# Patient Record
Sex: Female | Born: 1970 | Race: White | Hispanic: No | Marital: Married | Smoking: Never smoker
Health system: Southern US, Community
[De-identification: ages and names within clinical notes are randomized; demographics above are authoritative.]

## PROBLEM LIST (undated history)

## (undated) ENCOUNTER — Emergency Department (HOSPITAL_BASED_OUTPATIENT_CLINIC_OR_DEPARTMENT_OTHER): Admission: EM | Source: Home / Self Care

## (undated) DIAGNOSIS — E785 Hyperlipidemia, unspecified: Secondary | ICD-10-CM

## (undated) DIAGNOSIS — Z860100 Personal history of colon polyps, unspecified: Secondary | ICD-10-CM

## (undated) DIAGNOSIS — J45990 Exercise induced bronchospasm: Secondary | ICD-10-CM

## (undated) DIAGNOSIS — E559 Vitamin D deficiency, unspecified: Secondary | ICD-10-CM

## (undated) DIAGNOSIS — J309 Allergic rhinitis, unspecified: Secondary | ICD-10-CM

## (undated) DIAGNOSIS — D509 Iron deficiency anemia, unspecified: Secondary | ICD-10-CM

## (undated) DIAGNOSIS — F329 Major depressive disorder, single episode, unspecified: Secondary | ICD-10-CM

## (undated) DIAGNOSIS — K649 Unspecified hemorrhoids: Secondary | ICD-10-CM

## (undated) DIAGNOSIS — K219 Gastro-esophageal reflux disease without esophagitis: Secondary | ICD-10-CM

## (undated) DIAGNOSIS — K529 Noninfective gastroenteritis and colitis, unspecified: Secondary | ICD-10-CM

## (undated) DIAGNOSIS — E611 Iron deficiency: Secondary | ICD-10-CM

## (undated) DIAGNOSIS — Z973 Presence of spectacles and contact lenses: Secondary | ICD-10-CM

## (undated) DIAGNOSIS — G4733 Obstructive sleep apnea (adult) (pediatric): Secondary | ICD-10-CM

## (undated) DIAGNOSIS — M5134 Other intervertebral disc degeneration, thoracic region: Secondary | ICD-10-CM

## (undated) DIAGNOSIS — N183 Chronic kidney disease, stage 3 unspecified: Secondary | ICD-10-CM

## (undated) DIAGNOSIS — R000 Tachycardia, unspecified: Secondary | ICD-10-CM

## (undated) DIAGNOSIS — G8929 Other chronic pain: Secondary | ICD-10-CM

## (undated) DIAGNOSIS — E7800 Pure hypercholesterolemia, unspecified: Secondary | ICD-10-CM

## (undated) DIAGNOSIS — F952 Tourette's disorder: Secondary | ICD-10-CM

## (undated) DIAGNOSIS — K5901 Slow transit constipation: Secondary | ICD-10-CM

## (undated) DIAGNOSIS — M810 Age-related osteoporosis without current pathological fracture: Secondary | ICD-10-CM

## (undated) DIAGNOSIS — K2270 Barrett's esophagus without dysplasia: Secondary | ICD-10-CM

## (undated) DIAGNOSIS — F411 Generalized anxiety disorder: Secondary | ICD-10-CM

## (undated) DIAGNOSIS — H6990 Unspecified Eustachian tube disorder, unspecified ear: Secondary | ICD-10-CM

## (undated) DIAGNOSIS — M1990 Unspecified osteoarthritis, unspecified site: Secondary | ICD-10-CM

## (undated) DIAGNOSIS — M47812 Spondylosis without myelopathy or radiculopathy, cervical region: Secondary | ICD-10-CM

## (undated) HISTORY — DX: Noninfective gastroenteritis and colitis, unspecified: K52.9

## (undated) HISTORY — DX: Personal history of colon polyps, unspecified: Z86.0100

## (undated) HISTORY — DX: Vitamin D deficiency, unspecified: E55.9

## (undated) HISTORY — DX: Unspecified eustachian tube disorder, unspecified ear: H69.90

## (undated) HISTORY — DX: Unspecified osteoarthritis, unspecified site: M19.90

## (undated) HISTORY — DX: Gastro-esophageal reflux disease without esophagitis: K21.9

## (undated) HISTORY — DX: Barrett's esophagus without dysplasia: K22.70

## (undated) HISTORY — DX: Spondylosis without myelopathy or radiculopathy, cervical region: M47.812

## (undated) HISTORY — DX: Tourette's disorder: F95.2

## (undated) HISTORY — DX: Iron deficiency: E61.1

## (undated) HISTORY — DX: Age-related osteoporosis without current pathological fracture: M81.0

## (undated) HISTORY — DX: Pure hypercholesterolemia, unspecified: E78.00

## (undated) HISTORY — DX: Chronic kidney disease, stage 3 unspecified: N18.30

---

## 2004-04-23 ENCOUNTER — Other Ambulatory Visit: Admission: RE | Admit: 2004-04-23 | Discharge: 2004-04-23 | Payer: Self-pay | Admitting: Family Medicine

## 2004-07-21 ENCOUNTER — Ambulatory Visit (HOSPITAL_COMMUNITY): Admission: RE | Admit: 2004-07-21 | Discharge: 2004-07-21 | Payer: Self-pay | Admitting: Gastroenterology

## 2005-05-14 ENCOUNTER — Other Ambulatory Visit: Admission: RE | Admit: 2005-05-14 | Discharge: 2005-05-14 | Payer: Self-pay | Admitting: Family Medicine

## 2012-12-02 DIAGNOSIS — E785 Hyperlipidemia, unspecified: Secondary | ICD-10-CM | POA: Insufficient documentation

## 2013-02-27 DIAGNOSIS — F411 Generalized anxiety disorder: Secondary | ICD-10-CM | POA: Insufficient documentation

## 2013-10-24 DIAGNOSIS — F339 Major depressive disorder, recurrent, unspecified: Secondary | ICD-10-CM | POA: Insufficient documentation

## 2014-08-13 DIAGNOSIS — Z79899 Other long term (current) drug therapy: Secondary | ICD-10-CM | POA: Insufficient documentation

## 2015-11-27 HISTORY — PX: ROBOTIC ASSISTED LAP VAGINAL HYSTERECTOMY: SHX2362

## 2015-12-25 ENCOUNTER — Other Ambulatory Visit: Payer: Self-pay | Admitting: Obstetrics and Gynecology

## 2015-12-25 HISTORY — PX: ROBOTIC ASSISTED TOTAL HYSTERECTOMY WITH SALPINGECTOMY: SHX6679

## 2016-03-04 ENCOUNTER — Other Ambulatory Visit: Payer: Self-pay | Admitting: Family Medicine

## 2016-03-04 DIAGNOSIS — M47812 Spondylosis without myelopathy or radiculopathy, cervical region: Secondary | ICD-10-CM

## 2016-03-04 DIAGNOSIS — M5412 Radiculopathy, cervical region: Secondary | ICD-10-CM

## 2016-03-12 ENCOUNTER — Other Ambulatory Visit: Payer: Self-pay

## 2016-03-17 ENCOUNTER — Ambulatory Visit
Admission: RE | Admit: 2016-03-17 | Discharge: 2016-03-17 | Disposition: A | Discharge status: Home / Self Care | Payer: BLUE CROSS/BLUE SHIELD | Source: Ambulatory Visit | Attending: Family Medicine | Admitting: Family Medicine

## 2016-03-17 DIAGNOSIS — M5412 Radiculopathy, cervical region: Secondary | ICD-10-CM

## 2016-03-17 DIAGNOSIS — M47812 Spondylosis without myelopathy or radiculopathy, cervical region: Secondary | ICD-10-CM

## 2016-03-17 MED ORDER — IOHEXOL 300 MG/ML  SOLN
1.0000 mL | Freq: Once | INTRAMUSCULAR | Status: AC | PRN
  Administered 2016-03-17: 1 mL via EPIDURAL

## 2016-03-17 MED ORDER — TRIAMCINOLONE ACETONIDE 40 MG/ML IJ SUSP (RADIOLOGY)
60.0000 mg | Freq: Once | INTRAMUSCULAR | Status: AC
  Administered 2016-03-17: 60 mg via EPIDURAL

## 2016-03-17 NOTE — Discharge Instructions (Signed)

## 2016-04-15 ENCOUNTER — Ambulatory Visit (INDEPENDENT_AMBULATORY_CARE_PROVIDER_SITE_OTHER): Payer: BLUE CROSS/BLUE SHIELD | Admitting: Family Medicine

## 2016-04-15 ENCOUNTER — Encounter: Payer: Self-pay | Admitting: Family Medicine

## 2016-04-15 VITALS — BP 137/83 | HR 93 | Ht 65.0 in | Wt 155.0 lb

## 2016-04-15 DIAGNOSIS — G609 Hereditary and idiopathic neuropathy, unspecified: Secondary | ICD-10-CM | POA: Diagnosis not present

## 2016-04-15 DIAGNOSIS — M542 Cervicalgia: Secondary | ICD-10-CM | POA: Diagnosis not present

## 2016-04-15 NOTE — Patient Instructions (Signed)
Given the distribution of your numbness, the relatively normal CT scans you may be dealing with a neurologic condition and not a neck issue. We will evaluate for both - we will refer you to a neurologist but also do an MRI of your cervical spine.

## 2016-04-19 DIAGNOSIS — G609 Hereditary and idiopathic neuropathy, unspecified: Secondary | ICD-10-CM | POA: Insufficient documentation

## 2016-04-19 NOTE — Progress Notes (Addendum)
PCP: Dr. Caffie Damme  Subjective:   HPI: Patient is a 45 y.o. female here for neck pain.  Patient reports she's had over 2 months of unusual numbness, tingling. Reports symptoms have occurred in neck, into both arms, around mouth, and in her feet. Has tried muscle relaxants, tiger balm, prednisone without benefit. PCP ordered CT of cervical spine along with ESI - had 1-2 weeks benefit from the [facility]. No bowel/bladder dysfunction. No speech, vision changes.  No past medical history on file.  Current Outpatient Prescriptions on File Prior to Visit  Medication Sig Dispense Refill  . fexofenadine (ALLEGRA) 180 MG tablet 1 mg.    . ibuprofen (ADVIL,MOTRIN) 600 MG tablet Take 600 mg by mouth.    . methocarbamol (ROBAXIN) 500 MG tablet Take 1,000 mg by mouth.    . mirabegron ER (MYRBETRIQ) 25 MG TB24 tablet Take 25 mg by mouth.    . montelukast (SINGULAIR) 10 MG tablet Take 10 mg by mouth.    Marland Kitchen omeprazole (PRILOSEC) 40 MG capsule Take 40 mg by mouth.    . oxyCODONE-acetaminophen (PERCOCET/ROXICET) 5-325 MG tablet Take by mouth.    . simvastatin (ZOCOR) 40 MG tablet Take 40 mg by mouth.     No current facility-administered medications on file prior to visit.     No past surgical history on file.  No Known Allergies  Social History   Social History  . Marital status: Married    Spouse name: N/A  . Number of children: N/A  . Years of education: N/A   Occupational History  . Not on file.   Social History Main Topics  . Smoking status: Never Smoker  . Smokeless tobacco: Never Used  . Alcohol use Not on file  . Drug use: Unknown  . Sexual activity: Not on file   Other Topics Concern  . Not on file   Social History Narrative  . No narrative on file    No family history on file.  BP 137/83   Pulse 93   Ht 5\' 5"  (1.651 m)   Wt 155 lb (70.3 kg)   BMI 25.79 kg/m   Review of Systems: See HPI above.    Objective:  Physical Exam:  Gen: NAD, comfortable in exam  room  Neck: No gross deformity, swelling, bruising. TTP mildly midline low cervical region. FROM neck without pain. BUE strength 5/5.   Sensation intact to light touch currently.   2+ equal reflexes in triceps, biceps, brachioradialis tendons. Negative spurlings. NV intact distal BUEs.  Back: No gross deformity, scoliosis. No TTP .  No midline or bony TTP. FROM. Strength LEs 5/5 all muscle groups.   2+ MSRs in patellar and achilles tendons, equal bilaterally. Negative SLRs. Sensation intact to light touch bilaterally.    Assessment & Plan:  1. Neuropathy - unusual symptoms and distribution in otherwise healthy person.  No history of diabetes.  Distribution would suggest more of a neurologic issue than from a simple disc bulge or herniation - one exception may be a central disc bulge/herniation but this unlikely to cause the intermittent perioral numbness.  Advised we go ahead with neurology referral.  MRI of cervical spine as well.  Tylenol or nsaids if needed for pain (which is mild, does not have currently).  Addendum:  MRI reviewed and discussed with patient.  She only has left sided disc/osteophyte complex at C6-7.  No central disc bulge or spinal stenosis that could possibly account for her extremity symptoms.  Encouraged to follow  through with neurology referral for evaluation.

## 2016-04-19 NOTE — Assessment & Plan Note (Signed)
unusual symptoms and distribution in otherwise healthy person.  No history of diabetes.  Distribution would suggest more of a neurologic issue than from a simple disc bulge or herniation - one exception may be a central disc bulge/herniation but this unlikely to cause the intermittent perioral numbness.  Advised we go ahead with neurology referral.  MRI of cervical spine as well.  Tylenol or nsaids if needed for pain (which is mild, does not have currently).

## 2016-04-25 ENCOUNTER — Ambulatory Visit
Admission: RE | Admit: 2016-04-25 | Discharge: 2016-04-25 | Disposition: A | Discharge status: Home / Self Care | Payer: BLUE CROSS/BLUE SHIELD | Source: Ambulatory Visit | Attending: Family Medicine | Admitting: Family Medicine

## 2016-04-25 DIAGNOSIS — M542 Cervicalgia: Secondary | ICD-10-CM

## 2016-04-26 NOTE — Addendum Note (Signed)
Addended by: Kathi SimpersWISE, Dinnis Rog F on: 04/26/2016 08:27 AM   Modules accepted: Orders

## 2016-04-28 ENCOUNTER — Telehealth: Payer: Self-pay | Admitting: Family Medicine

## 2016-04-28 NOTE — Telephone Encounter (Signed)
Spoke with patient re: MRI results.  Will check on her neurology referral.

## 2016-05-11 ENCOUNTER — Encounter: Payer: Self-pay | Admitting: Diagnostic Neuroimaging

## 2016-05-11 ENCOUNTER — Ambulatory Visit (INDEPENDENT_AMBULATORY_CARE_PROVIDER_SITE_OTHER): Payer: BLUE CROSS/BLUE SHIELD | Admitting: Diagnostic Neuroimaging

## 2016-05-11 VITALS — BP 128/78 | HR 88 | Ht 65.0 in | Wt 156.8 lb

## 2016-05-11 DIAGNOSIS — R200 Anesthesia of skin: Secondary | ICD-10-CM

## 2016-05-11 DIAGNOSIS — R202 Paresthesia of skin: Secondary | ICD-10-CM | POA: Diagnosis not present

## 2016-05-11 NOTE — Patient Instructions (Signed)
Thank you for coming to see [place] at [facility]. I hope we have been able to provide you high quality care today.  You may receive a patient satisfaction survey over the next few weeks. We would appreciate your feedback and comments so that we may continue to improve ourselves and the health of our patients.  - I will check MRI brain   ~~~~~~~~~~~~~~~~~~~~~~~~~~~~~~~~~~~~~~~~~~~~~~~~~~~~~~~~~~~~~~~~~  DR. PENUMALLI'S GUIDE TO HAPPY AND HEALTHY LIVING These are some of my general health and wellness recommendations. Some of them may apply to you better than others. Please use common sense as you try these suggestions and feel free to ask me any questions.   ACTIVITY/FITNESS Mental, social, emotional and physical stimulation are very important for brain and body health. Try learning a new activity (arts, music, language, sports, games).  Keep moving your body to the best of your abilities. You can do this at home, inside or outside, the park, community center, gym or anywhere you like. Consider a physical therapist or personal trainer to get started. Consider the app Sworkit. Fitness trackers such as smart-watches, smart-phones or Fitbits can help as well.   NUTRITION Eat more plants: colorful vegetables, nuts, seeds and berries.  Eat less sugar, salt, preservatives and processed foods.  Avoid toxins such as cigarettes and alcohol.  Drink water when you are thirsty. Warm water with a slice of lemon is an excellent morning drink to start the day.  Consider these websites for more information The Nutrition Source (https://www.henry-hernandez.biz/) Precision Nutrition (WindowBlog.ch)   RELAXATION Consider practicing mindfulness meditation or other relaxation techniques such as deep breathing, prayer, yoga, tai chi, massage. See website mindful.org or the apps Headspace or Calm to help get started.   SLEEP Try to get at least  7-8+ hours sleep per day. Regular exercise and reduced caffeine will help you sleep better. Practice good sleep hygeine techniques. See website sleep.org for more information.   PLANNING Prepare estate planning, living will, healthcare POA documents. Sometimes this is best planned with the help of an attorney. Theconversationproject.org and agingwithdignity.org are excellent resources.

## 2016-05-11 NOTE — Progress Notes (Signed)
[facility]  PATIENT: Shelley Armstrong DOB: [birth year]  REFERRING CLINICIAN: Norton BlizzardShane Armstrong HISTORY FROM: patient  REASON FOR VISIT: new consult    HISTORICAL  CHIEF COMPLAINT:  Chief Complaint  Patient presents with  . Peripheral Neuropathy    rm 6, New Pt, "tingling/numbness (like asleep feeling) in both hands, both feet, neck, around the mouth"  . Pain    HISTORY OF PRESENT ILLNESS:   45 year old female here for evaluation of numbness and tingling. 2-3 months ago patient onset of numbness and tingling in the fingers, feet, neck, lips and mouth. Symptoms fluctuate but last hours at a time. Symptoms are worse in the evening. Patient denies any weakness, balance or gait difficulties. Patient [facility]. She had MRI of the cervical spine which showed disc bulge at C6-7 level and was treated with epidural steroid injection. Symptoms have slightly improved but continue. Also having increasing headaches.  Patient has long history of anxiety since age 45 years old. She is not been on medication for the past 5 years. Anxiety is mild under control.  Patient has snoring and has been diagnosed with sleep apnea in the past but is not on CPAP therapy.  Patient having increasing chills and sweats.  Patient reports normal B12, thyroid function and diabetes testing per PCP.   REVIEW OF SYSTEMS: Full 14 system review of systems performed and negative with exception of: Only as per history of present illness.  ALLERGIES: No Known Allergies  HOME MEDICATIONS: Outpatient Medications Prior to Visit  Medication Sig Dispense Refill  . fexofenadine (ALLEGRA) 180 MG tablet 1 mg.    . fluticasone (FLONASE) 50 MCG/ACT nasal spray     . montelukast (SINGULAIR) 10 MG tablet Take 10 mg by mouth.    Marland Kitchen. ibuprofen (ADVIL,MOTRIN) 600 MG tablet Take 600 mg by mouth.    . methocarbamol (ROBAXIN) 500 MG tablet Take 1,000 mg by mouth.    . mirabegron ER  (MYRBETRIQ) 25 MG TB24 tablet Take 25 mg by mouth.    Marland Kitchen. omeprazole (PRILOSEC) 40 MG capsule Take 40 mg by mouth.    . oxyCODONE-acetaminophen (PERCOCET/ROXICET) 5-325 MG tablet Take by mouth.    . ranitidine (ZANTAC) 300 MG tablet     . simvastatin (ZOCOR) 40 MG tablet Take 40 mg by mouth.     No facility-administered medications prior to visit.     PAST MEDICAL HISTORY: Past Medical History:  Diagnosis Date  . Hypercholesterolemia     PAST SURGICAL HISTORY: Past Surgical History:  Procedure Laterality Date  . ROBOTIC ASSISTED LAP VAGINAL HYSTERECTOMY  11/2015   has ovaries    FAMILY HISTORY: Family History  Problem Relation Age of Onset  . Cancer Father   . Alzheimer's disease Father   . Multiple sclerosis Brother     SOCIAL HISTORY:  Social History   Social History  . Marital status: Married    Spouse name: Shelley Armstrong  . Number of children: 2  . Years of education: 14   Occupational History  .      home maker   Social History Main Topics  . Smoking status: Never Smoker  . Smokeless tobacco: Never Used  . Alcohol use No  . Drug use: No  . Sexual activity: Not on file   Other Topics Concern  . Not on file   Social History Narrative   lives with husband    caffeine -diet coke once a week  PHYSICAL EXAM  GENERAL EXAM/CONSTITUTIONAL: Vitals:  Vitals:   05/11/16 1101  BP: 128/78  Pulse: 88  Weight: 156 lb 12.8 oz (71.1 kg)  Height: 5\' 5"  (1.651 m)     Body mass index is 26.09 kg/m.  Visual Acuity Screening   Right eye Left eye Both eyes  Without correction:     With correction: 20/30 20/30      Patient is in no distress; well developed, nourished and groomed; neck is supple  CARDIOVASCULAR:  Examination of carotid arteries is normal; no carotid bruits  Regular rate and rhythm, no murmurs  Examination of peripheral vascular system by observation and palpation is normal  EYES:  Ophthalmoscopic exam of optic discs and posterior  segments is normal; no papilledema or hemorrhages  MUSCULOSKELETAL:  Gait, strength, tone, movements noted in Neurologic exam below  NEUROLOGIC: MENTAL STATUS:  No flowsheet data found.  awake, alert, oriented to person, place and time  recent and remote memory intact  normal attention and concentration  language fluent, comprehension intact, naming intact,   fund of knowledge appropriate  CRANIAL NERVE:   2nd - no papilledema on fundoscopic exam  2nd, 3rd, 4th, 6th - pupils equal and reactive to light, visual fields full to confrontation, extraocular muscles intact, no nystagmus  5th - facial sensation symmetric  7th - facial strength symmetric  8th - hearing intact  9th - palate elevates symmetrically, uvula midline  11th - shoulder shrug symmetric  12th - tongue protrusion midline  INTERMITTENT LEFT FACIAL TIC  MOTOR:   normal bulk and tone, full strength in the BUE, BLE  SENSORY:   normal and symmetric to light touch, pinprick, temperature, vibration  COORDINATION:   finger-nose-finger, fine finger movements normal  REFLEXES:   deep tendon reflexes present and symmetric  GAIT/STATION:   narrow based gait; able to walk tandem    DIAGNOSTIC DATA (LABS, IMAGING, TESTING) - I reviewed patient records, labs, notes, testing and imaging myself where available.  No results found for: WBC, HGB, HCT, MCV, PLT No results found for: NA, K, CL, CO2, GLUCOSE, BUN, CREATININE, CALCIUM, PROT, ALBUMIN, AST, ALT, ALKPHOS, BILITOT, GFRNONAA, GFRAA No results found for: CHOL, HDL, LDLCALC, LDLDIRECT, TRIG, CHOLHDL No results found for: ZOXW9U No results found for: VITAMINB12 No results found for: TSH  04/25/16 MRI cervical spine [I reviewed images myself and agree with interpretation. This is likely an incidental finding as patient has more widespread tingling sensations. -VRP]  - At C6-7: moderate left foraminal stenosis due to disc bulging and  osteophyte    ASSESSMENT AND PLAN  45 y.o. year old female here with new onset numbness and tingling in the lips, mouth, hands and feet for past 2-3 months. Also with increasing headaches, chills and sweats. Patient has family history of multiple sclerosis. We'll check MRI of the brain to rule out secondary causes.  Ddx: metabolic, autoimmune, inflamm, multiple sclerosis, other CNS etiology, anxiety state  1. Numbness and tingling     PLAN: - check MRI brain w wo (rule out MS or other CNS / brain causes of numbness and tingling) - ask PCP about B12, TSH, A1c and electrolyte testing lab results  Orders Placed This Encounter  Procedures  . MR BRAIN W WO CONTRAST   Return in about 3 months (around 08/11/2016).  I reviewed images, labs, notes, records myself. I summarized findings and reviewed with patient, for this high risk condition (numbness, tingling; rule out MS) requiring high complexity decision making.  Suanne MarkerVIKRAM R. Iyona Pehrson, MD 05/11/2016, 11:15 AM Certified in Neurology, Neurophysiology and Neuroimaging  [facility] [address], [address] [place], [place] [id number] [phone]

## 2016-05-12 ENCOUNTER — Ambulatory Visit (INDEPENDENT_AMBULATORY_CARE_PROVIDER_SITE_OTHER): Payer: BLUE CROSS/BLUE SHIELD

## 2016-05-12 DIAGNOSIS — R202 Paresthesia of skin: Secondary | ICD-10-CM

## 2016-05-12 DIAGNOSIS — R200 Anesthesia of skin: Secondary | ICD-10-CM | POA: Diagnosis not present

## 2016-05-13 MED ORDER — GADOPENTETATE DIMEGLUMINE 469.01 MG/ML IV SOLN: 14.0000 mL | Freq: Once | INTRAVENOUS | Status: DC | PRN

## 2016-05-14 ENCOUNTER — Telehealth: Payer: Self-pay | Admitting: Diagnostic Neuroimaging

## 2016-05-14 NOTE — Telephone Encounter (Signed)
Pt called in for MRI results. Please call

## 2016-05-14 NOTE — Telephone Encounter (Signed)
I called patient x 2. No answer. Patient has mildly abnormal MRI brain, and will need further MS workup (MRI thoracic spine, VEP, labs). -VRP

## 2016-05-17 ENCOUNTER — Other Ambulatory Visit: Payer: Self-pay | Admitting: Neurology

## 2016-05-17 DIAGNOSIS — R930 Abnormal findings on diagnostic imaging of skull and head, not elsewhere classified: Secondary | ICD-10-CM

## 2016-05-17 NOTE — Telephone Encounter (Signed)
I called the patient and described MRI findings showing nonspecific subcortical supratentorial white matter hyperintensities. I discussed the differential diagnosis including autoimmune, inflammatory conditions. Patient is not willing to wait for Dr. Marjory LiesPenumalli to come back next week to decide further evaluation and I'm going to go ahead and order lab work for which she plans to come to the office tomorrow to get done. I'm also ordering an MRI scan of thoracic spine. She already had an MRI scan of cervical spine a few weeks ago. She voiced understanding.

## 2016-05-17 NOTE — Telephone Encounter (Signed)
Patient called, states she spoke with someone earlier who advised that another doctor would call her, patient states that doctor has not called her back, patient advised Dr. Pearlean BrownieSethi will call after seeing patient's and Dr. Pearlean BrownieSethi has a full schedule. Patient asked what time we closed, patient advised the door is locked at 5:00 but depending on when they finish with their patient's it could be after 5:00 before Dr. Pearlean BrownieSethi calls. Patient expects phone call today.

## 2016-05-17 NOTE — Telephone Encounter (Signed)
Called patient and informed her that Dr Marjory LiesPenumalli attempted to reach her twice last Friday to inform her that her MRI was mildly abnormal. Advised her he wants to do additional testing of MRI thoracic spine and labs. Patient asked multiple questions regarding the testing; this RN advised her the orders have not been placed, and Dr Marjory LiesPenumalli is on vacation this week. This RN stated that the purpose of this call was to give her information, so she would not have to wait another week to get a call when Dr Marjory LiesPenumalli returns. Patient gave phone to her husband who stated "This is the worst phone call she's ever gotten." He expressed dissatisfaction that this RN could not give further information. He asked to speak with another docotr that is in the office today. This RN stated she would route to Dr Pearlean BrownieSethi as work in doctor to call. Husband requested his wife be called today at [phone].

## 2016-05-18 ENCOUNTER — Other Ambulatory Visit (INDEPENDENT_AMBULATORY_CARE_PROVIDER_SITE_OTHER): Payer: Self-pay

## 2016-05-18 DIAGNOSIS — Z0289 Encounter for other administrative examinations: Secondary | ICD-10-CM

## 2016-05-18 DIAGNOSIS — R930 Abnormal findings on diagnostic imaging of skull and head, not elsewhere classified: Secondary | ICD-10-CM

## 2016-05-18 NOTE — Telephone Encounter (Signed)
Noted. Will inform Dr Marjory LiesPenumalli when he returns next week.

## 2016-05-19 ENCOUNTER — Encounter: Payer: Self-pay | Admitting: Diagnostic Neuroimaging

## 2016-05-20 LAB — ANTIPHOSPHOLIPID SYNDROME EVAL, BLD
ANTICARDIOLIPIN IGM: 10 [MPL'U]/mL (ref 0–12)
APTT PPP: 23.8 s (ref 22.9–30.2)
Beta-2 Glyco 1 IgM: <9 GPI IgM units (ref 0–32)
Beta-2 Glyco I IgG: <9 GPI IgG units (ref 0–20)
DRVVT: 36.3 s (ref 0.0–47.0)
Hexagonal Phase Phospholipid: 2 s (ref 0–11)
INR: 0.9 (ref 0.9–1.1)
PT: 9.9 s (ref 9.6–11.5)
THROMBIN TIME: 21.5 s (ref 0.0–23.0)

## 2016-05-20 LAB — B. BURGDORFI ANTIBODIES

## 2016-05-20 LAB — SEDIMENTATION RATE: Sed Rate: 2 mm/hr (ref 0–32)

## 2016-05-20 LAB — HIV ANTIBODY (ROUTINE TESTING W REFLEX): HIV Screen 4th Generation wRfx: NONREACTIVE

## 2016-05-20 LAB — ANGIOTENSIN CONVERTING ENZYME: ANGIO CONVERT ENZYME: 31 U/L (ref 14–82)

## 2016-05-20 LAB — RPR: RPR: NONREACTIVE

## 2016-05-20 LAB — ANA: ANA: NEGATIVE

## 2016-05-20 LAB — COAG STUDIES INTERP REPORT

## 2016-05-24 ENCOUNTER — Telehealth: Payer: Self-pay

## 2016-05-24 ENCOUNTER — Telehealth: Payer: Self-pay | Admitting: Diagnostic Neuroimaging

## 2016-05-24 NOTE — Telephone Encounter (Signed)
Patient is calling to possibly schedule an MRI this Wednesday.

## 2016-05-24 NOTE — Telephone Encounter (Addendum)
Rn call patient that all of her lab work was normal. She verbalized understanding.  ----- Message from Garvin Fila, MD sent at 05/19/2016  4:50 PM EST ----- Kindly inform the patient that all lab work which is back so far is normal including ESR, ANA, Lyme titer, HIV, syphilis, sarcoid blood test

## 2016-05-24 NOTE — Telephone Encounter (Signed)
I called patient. Reviewed MRI and lab results. Next steps: MRI thoracic spine. If negative, then will check VEP and lumbar puncture. -VRP

## 2016-05-24 NOTE — Telephone Encounter (Signed)
Rn call patient that her lab work was normal. Pt verbalized understanding.

## 2016-05-24 NOTE — Telephone Encounter (Signed)
[facility]

## 2016-05-26 ENCOUNTER — Ambulatory Visit (INDEPENDENT_AMBULATORY_CARE_PROVIDER_SITE_OTHER): Payer: BLUE CROSS/BLUE SHIELD

## 2016-05-26 DIAGNOSIS — R930 Abnormal findings on diagnostic imaging of skull and head, not elsewhere classified: Secondary | ICD-10-CM | POA: Diagnosis not present

## 2016-05-27 ENCOUNTER — Encounter: Payer: Self-pay | Admitting: Diagnostic Neuroimaging

## 2016-05-27 MED ORDER — GADOPENTETATE DIMEGLUMINE 469.01 MG/ML IV SOLN: 14.0000 mL | Freq: Once | INTRAVENOUS | Status: DC | PRN

## 2016-05-28 ENCOUNTER — Encounter: Payer: Self-pay | Admitting: Diagnostic Neuroimaging

## 2016-06-02 ENCOUNTER — Ambulatory Visit (INDEPENDENT_AMBULATORY_CARE_PROVIDER_SITE_OTHER): Payer: BLUE CROSS/BLUE SHIELD | Admitting: Diagnostic Neuroimaging

## 2016-06-02 ENCOUNTER — Encounter: Payer: Self-pay | Admitting: Diagnostic Neuroimaging

## 2016-06-02 ENCOUNTER — Telehealth: Payer: Self-pay

## 2016-06-02 VITALS — BP 130/88 | HR 92 | Wt 158.4 lb

## 2016-06-02 DIAGNOSIS — R9089 Other abnormal findings on diagnostic imaging of central nervous system: Secondary | ICD-10-CM | POA: Diagnosis not present

## 2016-06-02 DIAGNOSIS — R202 Paresthesia of skin: Secondary | ICD-10-CM

## 2016-06-02 DIAGNOSIS — R519 Headache, unspecified: Secondary | ICD-10-CM

## 2016-06-02 DIAGNOSIS — R200 Anesthesia of skin: Secondary | ICD-10-CM

## 2016-06-02 DIAGNOSIS — R51 Headache: Secondary | ICD-10-CM

## 2016-06-02 MED ORDER — GABAPENTIN 300 MG PO CAPS: 300.0000 mg | ORAL_CAPSULE | Freq: Two times a day (BID) | ORAL | 6 refills | Status: DC

## 2016-06-02 NOTE — Progress Notes (Signed)
[facility]  PATIENT: Shelley Armstrong DOB: [birth year]  REFERRING CLINICIAN: Audelia Acton Hudnall HISTORY FROM: patient  REASON FOR VISIT: follow up   HISTORICAL  CHIEF COMPLAINT:  Chief Complaint  Patient presents with  . Numbness and tingling    rm 7, husband- Bill, "tingling, numbness (sleeplike feeling) in hands, arms, feet, face; headaches"  . Follow-up    FU req to discuss test results    HISTORY OF PRESENT ILLNESS:   UPDATE 06/02/16: Since last visit, doing about the same. Still with numbness and tingling and headaches.  PRIOR HPI (05/11/16): 46 year old female here for evaluation of numbness and tingling. 2-3 months ago patient onset of numbness and tingling in the fingers, feet, neck, lips and mouth. Symptoms fluctuate but last hours at a time. Symptoms are worse in the evening. Patient denies any weakness, balance or gait difficulties. Patient initially went to PCP in [facility]. She had MRI of the cervical spine which showed disc bulge at C6-7 level and was treated with epidural steroid injection. Symptoms have slightly improved but continue. Also having increasing headaches. Patient has long history of anxiety since age 50 years old. She is not been on medication for the past 5 years. Anxiety is mild under control. Patient has snoring and has been diagnosed with sleep apnea in the past but is not on CPAP therapy. Patient having increasing chills and sweats. Patient reports normal B12, thyroid function and diabetes testing per PCP.    REVIEW OF SYSTEMS: Full 14 system review of systems performed and negative with exception of: Only as per history of present illness.  ALLERGIES: No Known Allergies  HOME MEDICATIONS: Outpatient Medications Prior to Visit  Medication Sig Dispense Refill  . ferrous sulfate 325 (65 FE) MG EC tablet Take 325 mg by mouth 3 (three) times daily with meals.    . fexofenadine (ALLEGRA) 180 MG tablet 1 mg.    . fluticasone  (FLONASE) 50 MCG/ACT nasal spray     . ibuprofen (ADVIL,MOTRIN) 600 MG tablet Take 600 mg by mouth.    . Lactobacillus (PROBIOTIC ACIDOPHILUS PO) Take by mouth. 90 billion daily    . montelukast (SINGULAIR) 10 MG tablet Take 10 mg by mouth.    . Multiple Vitamin (MULTIVITAMIN) tablet Take 1 tablet by mouth daily.    . cefdinir (OMNICEF) 300 MG capsule 300 mg 2 (two) times daily.     Facility-Administered Medications Prior to Visit  Medication Dose Route Frequency Provider Last Rate Last Dose  . gadopentetate dimeglumine (MAGNEVIST) injection 14 mL  14 mL Intravenous Once PRN Penni Bombard, MD      . gadopentetate dimeglumine (MAGNEVIST) injection 14 mL  14 mL Intravenous Once PRN Penni Bombard, MD        PAST MEDICAL HISTORY: Past Medical History:  Diagnosis Date  . Hypercholesterolemia     PAST SURGICAL HISTORY: Past Surgical History:  Procedure Laterality Date  . ROBOTIC ASSISTED LAP VAGINAL HYSTERECTOMY  11/2015   has ovaries    FAMILY HISTORY: Family History  Problem Relation Age of Onset  . Cancer Father   . Alzheimer's disease Father   . Multiple sclerosis Brother     SOCIAL HISTORY:  Social History   Social History  . Marital status: Married    Spouse name: Rush Landmark  . Number of children: 2  . Years of education: 14   Occupational History  .      home maker   Social History Main Topics  .  Smoking status: Never Smoker  . Smokeless tobacco: Never Used  . Alcohol use No  . Drug use: No  . Sexual activity: Not on file   Other Topics Concern  . Not on file   Social History Narrative   lives with husband    caffeine -diet coke once a week     PHYSICAL EXAM  GENERAL EXAM/CONSTITUTIONAL: Vitals:  Vitals:   06/02/16 1526  BP: 130/88  Pulse: 92  Weight: 158 lb 6.4 oz (71.8 kg)   Body mass index is 26.36 kg/m. No exam data present  Patient is in no distress; well developed, nourished and groomed; neck is  supple  CARDIOVASCULAR:  Examination of carotid arteries is normal; no carotid bruits  Regular rate and rhythm, no murmurs  Examination of peripheral vascular system by observation and palpation is normal  EYES:  Ophthalmoscopic exam of optic discs and posterior segments is normal; no papilledema or hemorrhages  MUSCULOSKELETAL:  Gait, strength, tone, movements noted in Neurologic exam below  NEUROLOGIC: MENTAL STATUS:  No flowsheet data found.  awake, alert, oriented to person, place and time  recent and remote memory intact  normal attention and concentration  language fluent, comprehension intact, naming intact,   fund of knowledge appropriate  CRANIAL NERVE:   2nd - no papilledema on fundoscopic exam  2nd, 3rd, 4th, 6th - pupils equal and reactive to light, visual fields full to confrontation, extraocular muscles intact, no nystagmus  5th - facial sensation symmetric  7th - facial strength symmetric  8th - hearing intact  9th - palate elevates symmetrically, uvula midline  11th - shoulder shrug symmetric  12th - tongue protrusion midline  INTERMITTENT LEFT FACIAL TIC  MOTOR:   normal bulk and tone, full strength in the BUE, BLE  SENSORY:   normal and symmetric to light touch, temperature, vibration  COORDINATION:   finger-nose-finger, fine finger movements normal  REFLEXES:   deep tendon reflexes present and symmetric  GAIT/STATION:   narrow based gait; able to walk tandem    DIAGNOSTIC DATA (LABS, IMAGING, TESTING) - I reviewed patient records, labs, notes, testing and imaging myself where available.  No results found for: WBC, HGB, HCT, MCV, PLT No results found for: NA, K, CL, CO2, GLUCOSE, BUN, CREATININE, CALCIUM, PROT, ALBUMIN, AST, ALT, ALKPHOS, BILITOT, GFRNONAA, GFRAA No results found for: CHOL, HDL, LDLCALC, LDLDIRECT, TRIG, CHOLHDL No results found for: HGBA1C No results found for: VITAMINB12 No results found for:  TSH  04/25/16 MRI cervical spine [I reviewed images myself and agree with interpretation. This is likely an incidental finding as patient has more widespread tingling sensations. -VRP]  - At C6-7: moderate left foraminal stenosis due to disc bulging and osteophyte  05/12/16 MRI brain (with and without) [I reviewed images myself and agree with interpretation. -VRP]  1. Mild scattered round and ovoid, periventricular and subcortical T2 hyperintensities. These findings are non-specific and considerations include autoimmune, inflammatory, post-infectious, microvascular ischemic or migraine associated etiologies.  2. No abnormal lesions are seen on post contrast views.   3. No acute findings.  05/26/16 MRI thoracic spine [I reviewed images myself and agree with interpretation. -VRP]  - normal   05/18/16 Labs: ANA, hypercoag panel, ESR, ACE, lyme, HIV, RPR, ANA, APAS - all negative      ASSESSMENT AND PLAN  45 y.o. year old female here with new onset numbness and tingling in the lips, mouth, hands and feet for past 2-3 months. Also with increasing headaches, chills and sweats. Patient  has family history of multiple sclerosis (brother). MRI brain is slightly abnormal and needs further workup. No spinal cord lesions.    Ddx: autoimmune, inflamm, multiple sclerosis, migraine variant, anxiety state  1. Numbness and tingling   2. Nonintractable headache, unspecified chronicity pattern, unspecified headache type   3. MRI of brain abnormal      PLAN: - check lumbar puncture (evaluate for demyelinating disease) - gabapentin 370m at bedtime; may increase to twice a day  Orders Placed This Encounter  Procedures  . DG FLUORO GUIDED LOC OF NEEDLE/CATH TIP FOR SPINAL INJECT LT   Meds ordered this encounter  Medications  . gabapentin (NEURONTIN) 300 MG capsule    Sig: Take 1 capsule (300 mg total) by mouth 2 (two) times daily.    Dispense:  60 capsule    Refill:  6   Return in about 2  months (around 08/03/2016).   VPenni Bombard MD 176/06/9507 43:26PM Certified in Neurology, Neurophysiology and Neuroimaging  [facility] [address] [place] [phone]

## 2016-06-02 NOTE — Telephone Encounter (Signed)
-----   Message from Micki RileyPramod S Sethi, MD sent at 05/28/2016  8:51 PM EST ----- Kindly inform the patient that MRI scan of the thoracic spine was normal

## 2016-06-02 NOTE — Telephone Encounter (Signed)
RN call her about the MRi thoracic spine. RN stated it was normal. Pt verbalized understanding.

## 2016-06-02 NOTE — Patient Instructions (Signed)
-   check lumbar puncture (evaluate for demyelinating disease)  - gabapentin 300mg  at bedtime; may increase to twice a day after 1 week

## 2016-06-08 ENCOUNTER — Other Ambulatory Visit (HOSPITAL_COMMUNITY): Payer: Self-pay

## 2016-06-08 ENCOUNTER — Other Ambulatory Visit (HOSPITAL_COMMUNITY)
Admission: RE | Admit: 2016-06-08 | Discharge: 2016-06-08 | Disposition: A | Discharge status: Home / Self Care | Payer: BLUE CROSS/BLUE SHIELD | Source: Ambulatory Visit | Attending: Diagnostic Neuroimaging | Admitting: Diagnostic Neuroimaging

## 2016-06-08 ENCOUNTER — Other Ambulatory Visit (HOSPITAL_COMMUNITY)
Admit: 2016-06-08 | Discharge: 2016-06-08 | Disposition: A | Discharge status: Home / Self Care | Payer: BLUE CROSS/BLUE SHIELD | Source: Other Acute Inpatient Hospital | Attending: Diagnostic Neuroimaging | Admitting: Diagnostic Neuroimaging

## 2016-06-08 ENCOUNTER — Ambulatory Visit
Admission: RE | Admit: 2016-06-08 | Discharge: 2016-06-08 | Disposition: A | Discharge status: Home / Self Care | Payer: BLUE CROSS/BLUE SHIELD | Source: Ambulatory Visit | Attending: Diagnostic Neuroimaging | Admitting: Diagnostic Neuroimaging

## 2016-06-08 DIAGNOSIS — R202 Paresthesia of skin: Secondary | ICD-10-CM | POA: Insufficient documentation

## 2016-06-08 DIAGNOSIS — R200 Anesthesia of skin: Secondary | ICD-10-CM | POA: Insufficient documentation

## 2016-06-08 DIAGNOSIS — R9089 Other abnormal findings on diagnostic imaging of central nervous system: Secondary | ICD-10-CM | POA: Diagnosis present

## 2016-06-08 LAB — CSF CELL COUNT WITH DIFFERENTIAL
RBC Count, CSF: 0 cells/uL (ref 0–10)
WBC, CSF: 2 cells/uL (ref 0–5)

## 2016-06-08 LAB — GLUCOSE, CSF: Glucose, CSF: 61 mg/dL (ref 43–76)

## 2016-06-08 LAB — PROTEIN, CSF: TOTAL PROTEIN, CSF: 33 mg/dL (ref 15–45)

## 2016-06-08 NOTE — Discharge Instructions (Signed)

## 2016-06-08 NOTE — Progress Notes (Signed)
1 SST tube drawn from right AC to go with spinal fluid. Site is unremarkable and pt tolerated procedure well. 

## 2016-06-11 ENCOUNTER — Telehealth: Payer: Self-pay | Admitting: Diagnostic Neuroimaging

## 2016-06-11 NOTE — Telephone Encounter (Signed)
Called patient and informed her that not all LP results are back. [facility] results to Dr Marjory LiesPenumalli. Advised her that this RN will route her request to Dr Marjory LiesPenumalli. She is requesting a call back if results are ready. She thanked this Charity fundraiserN for call.

## 2016-06-11 NOTE — Telephone Encounter (Signed)
Labs not available yet. Will check next week. -VRP

## 2016-06-11 NOTE — Telephone Encounter (Signed)
Pt request LP results

## 2016-06-12 ENCOUNTER — Encounter: Payer: Self-pay | Admitting: Diagnostic Neuroimaging

## 2016-06-12 LAB — CNS IGG SYNTHESIS RATE, CSF+BLOOD
Albumin, CSF: 17.9 mg/dL (ref 8.0–42.0)
Albumin, Serum(Neph): 4.1 g/dL (ref 3.5–4.9)
IgG Index, CSF: 0.43 (ref <0.66)
IgG, CSF: 1.2 mg/dL (ref 0.8–7.7)
IgG, Serum: 637 mg/dL — ABNORMAL LOW (ref 694–1618)
MS CNS IGG SYNTHESIS RATE: -2.7 mg/(24.h) (ref <=3.3)

## 2016-06-12 LAB — CSF CULTURE: ORGANISM ID, BACTERIA: NO GROWTH

## 2016-06-12 LAB — CSF CULTURE W GRAM STAIN
Gram Stain: NONE SEEN
Gram Stain: NONE SEEN

## 2016-06-14 ENCOUNTER — Encounter: Payer: Self-pay | Admitting: Diagnostic Neuroimaging

## 2016-06-14 LAB — OLIGOCLONAL BANDS, CSF + SERM

## 2016-06-14 NOTE — Telephone Encounter (Signed)
I called patient. CSF is unremarkable. No specific diagnosis at this time. No evidence of MS at this time. May represent anxiety disorder paresthesias. Will consider repeat MRI in 3-6 months. -VRP

## 2016-07-09 LAB — FUNGUS CULTURE W SMEAR

## 2016-08-06 ENCOUNTER — Encounter: Payer: Self-pay | Admitting: Diagnostic Neuroimaging

## 2016-08-10 ENCOUNTER — Ambulatory Visit: Payer: BLUE CROSS/BLUE SHIELD | Admitting: Diagnostic Neuroimaging

## 2017-02-15 ENCOUNTER — Ambulatory Visit (INDEPENDENT_AMBULATORY_CARE_PROVIDER_SITE_OTHER): Payer: BLUE CROSS/BLUE SHIELD | Admitting: Family Medicine

## 2017-02-15 ENCOUNTER — Encounter: Payer: Self-pay | Admitting: Family Medicine

## 2017-02-15 DIAGNOSIS — M79604 Pain in right leg: Secondary | ICD-10-CM

## 2017-02-15 DIAGNOSIS — M545 Low back pain, unspecified: Secondary | ICD-10-CM | POA: Insufficient documentation

## 2017-02-15 MED ORDER — PREDNISONE 10 MG PO TABS: ORAL_TABLET | ORAL | 0 refills | Status: DC

## 2017-02-15 NOTE — Assessment & Plan Note (Signed)
consistent with lumbar radiculopathy though exam reassuring.  Start with prednisone dose pack, tylenol if needed.  She has tramadol and flexeril to take as needed also.  Consider physical therapy - given home exercises to do for now.  Consider MRI if not improving.  F/u in 1 month for reevaluation.

## 2017-02-15 NOTE — Patient Instructions (Signed)
You have lumbar radiculopathy (a pinched nerve in your low back). Take tylenol for baseline pain relief (1-2 extra strength tabs 3x/day) A prednisone dose pack is the best option for immediate relief and may be prescribed. Day after finishing prednisone start aleve 2 tabs twice a day with food for pain and inflammation. You can take the tramadol and cyclobenzaprine as needed in addition to the prednisone. Stay as active as possible. Physical therapy has been shown to be helpful as well. Strengthening of low back muscles, abdominal musculature are key for long term pain relief - do home exercises/stretches as directed in handout (pick 3-5). If not improving, will consider further imaging (MRI). Follow up with me in 1 month but call me if you're not improving over the next 1-2 weeks.

## 2017-02-15 NOTE — Progress Notes (Signed)
PCP: Caffie Damme, MD  Subjective:   HPI: Patient is a 46 y.o. female here for low back pain.  Patient reports about 2 weeks ago she started to get pain in middle of low back radiating into right leg to the calf. No acute injury or trauma. Pain sharp and up to 8/10 level. No numbness or tingling. No bowel/bladder dysfunction. Has tried tramadol and flexeril without much benefit. Had radiographs which I reviewed today - only mild low lumbar degenerative changes.  Past Medical History:  Diagnosis Date  . Hypercholesterolemia     Current Outpatient Prescriptions on File Prior to Visit  Medication Sig Dispense Refill  . ferrous sulfate 325 (65 FE) MG EC tablet Take 325 mg by mouth 3 (three) times daily with meals.    . fexofenadine (ALLEGRA) 180 MG tablet 1 mg.    . fluconazole (DIFLUCAN) 150 MG tablet 150 mg.    . fluticasone (FLONASE) 50 MCG/ACT nasal spray     . gabapentin (NEURONTIN) 300 MG capsule Take 1 capsule (300 mg total) by mouth 2 (two) times daily. 60 capsule 6  . ibuprofen (ADVIL,MOTRIN) 600 MG tablet Take 600 mg by mouth.    . Lactobacillus (PROBIOTIC ACIDOPHILUS PO) Take by mouth. 90 billion daily    . montelukast (SINGULAIR) 10 MG tablet Take 10 mg by mouth.    . Multiple Vitamin (MULTIVITAMIN) tablet Take 1 tablet by mouth daily.    Marland Kitchen nystatin cream (MYCOSTATIN)      Current Facility-Administered Medications on File Prior to Visit  Medication Dose Route Frequency Provider Last Rate Last Dose  . gadopentetate dimeglumine (MAGNEVIST) injection 14 mL  14 mL Intravenous Once PRN Penumalli, Vikram R, MD      . gadopentetate dimeglumine (MAGNEVIST) injection 14 mL  14 mL Intravenous Once PRN Penumalli, Glenford Bayley, MD        Past Surgical History:  Procedure Laterality Date  . ROBOTIC ASSISTED LAP VAGINAL HYSTERECTOMY  11/2015   has ovaries    No Known Allergies  Social History   Social History  . Marital status: Married    Spouse name: Annette Stable  . Number of  children: 2  . Years of education: 14   Occupational History  .      home maker   Social History Main Topics  . Smoking status: Never Smoker  . Smokeless tobacco: Never Used  . Alcohol use No  . Drug use: No  . Sexual activity: Not on file   Other Topics Concern  . Not on file   Social History Narrative   lives with husband    caffeine -diet coke once a week    Family History  Problem Relation Age of Onset  . Cancer Father   . Alzheimer's disease Father   . Multiple sclerosis Brother     BP 113/79   Pulse 96   Ht 5\' 5"  (1.651 m)   Wt 152 lb 3.2 oz (69 kg)   BMI 25.33 kg/m   Review of Systems: See HPI above.     Objective:  Physical Exam:  Gen: NAD, comfortable in exam room  Back: No gross deformity, scoliosis. No TTP.  No midline or bony TTP. FROM without pain. Strength LEs 5/5 all muscle groups.   2+ MSRs in patellar and achilles tendons, equal bilaterally. Negative SLRs. Sensation intact to light touch bilaterally. Negative logroll bilateral hips Negative fabers and piriformis stretches.  Assessment & Plan:  1. Low back pain with radiation into right leg -  consistent with lumbar radiculopathy though exam reassuring.  Start with prednisone dose pack, tylenol if needed.  She has tramadol and flexeril to take as needed also.  Consider physical therapy - given home exercises to do for now.  Consider MRI if not improving.  F/u in 1 month for reevaluation.

## 2017-02-21 ENCOUNTER — Telehealth: Payer: Self-pay | Admitting: Family Medicine

## 2017-02-21 NOTE — Telephone Encounter (Signed)
Spoke to patient and gave her the information about the medication.

## 2017-02-21 NOTE — Telephone Encounter (Signed)
Patient has questions regarding mediation.  She has finished her prednisone pack and wants to know if she can take the tramadol and cyclobenzaprine with the aleve tabs Also wants to know if she should take 2 aleve tabs twice a day until 24mo f/u on 9/21 or just take the aleve as needed

## 2017-02-21 NOTE — Telephone Encounter (Signed)
Aleve only if needed at this point.  And it's ok to take aleve with the tramadol and the cyclobenzaprine (all 3 can be taken together).

## 2017-03-18 ENCOUNTER — Encounter: Payer: Self-pay | Admitting: Family Medicine

## 2017-03-18 ENCOUNTER — Ambulatory Visit (INDEPENDENT_AMBULATORY_CARE_PROVIDER_SITE_OTHER): Payer: BLUE CROSS/BLUE SHIELD | Admitting: Family Medicine

## 2017-03-18 VITALS — BP 124/78 | HR 98 | Ht 65.0 in | Wt 152.0 lb

## 2017-03-18 DIAGNOSIS — M545 Low back pain: Secondary | ICD-10-CM

## 2017-03-18 DIAGNOSIS — M79604 Pain in right leg: Secondary | ICD-10-CM

## 2017-03-18 NOTE — Patient Instructions (Signed)
We will go ahead with an MRI of your lumbar spine at this point to assess for a disc herniation pinching a nerve on the right side.  I will call you with the results and next steps. It's ok for you to do the races tonight and tomorrow in the meantime. You can take you tramadol, flexeril if needed.

## 2017-03-22 NOTE — Assessment & Plan Note (Signed)
consistent with lumbar radiculopathy.  Mild improvement with prednisone but seemed transient.  Doing home exercises.  Tramadol and flexeril if needed.  Will go ahead with MRI to assess for source of radiculopathy.

## 2017-03-22 NOTE — Progress Notes (Addendum)
PCP: Caffie Damme, MD  Subjective:   HPI: Patient is a 46 y.o. female here for low back pain.  8/21: Patient reports about 2 weeks ago she started to get pain in middle of low back radiating into right leg to the calf. No acute injury or trauma. Pain sharp and up to 8/10 level. No numbness or tingling. No bowel/bladder dysfunction. Has tried tramadol and flexeril without much benefit. Had radiographs which I reviewed today - only mild low lumbar degenerative changes.  9/21: Patient reports she felt prednisone helped a little bit but not a lot. She is still doing home exercises and yoga. Takes tramadol or flexeril if needed and these help some. Pain level up to 7/10 and sharp. Radiates into right leg to about the foot now but not toes. No numbness or tingling. No bowel/bladder dysfunction.  Past Medical History:  Diagnosis Date  . Hypercholesterolemia     Current Outpatient Prescriptions on File Prior to Visit  Medication Sig Dispense Refill  . cyclobenzaprine (FLEXERIL) 5 MG tablet     . ferrous sulfate 325 (65 FE) MG EC tablet Take 325 mg by mouth 3 (three) times daily with meals.    . fexofenadine (ALLEGRA) 180 MG tablet 1 mg.    . fluconazole (DIFLUCAN) 150 MG tablet 150 mg.    . fluticasone (FLONASE) 50 MCG/ACT nasal spray     . gabapentin (NEURONTIN) 300 MG capsule Take 1 capsule (300 mg total) by mouth 2 (two) times daily. 60 capsule 6  . ibuprofen (ADVIL,MOTRIN) 600 MG tablet Take 600 mg by mouth.    . Lactobacillus (PROBIOTIC ACIDOPHILUS PO) Take by mouth. 90 billion daily    . montelukast (SINGULAIR) 10 MG tablet Take 10 mg by mouth.    . Multiple Vitamin (MULTIVITAMIN) tablet Take 1 tablet by mouth daily.    Marland Kitchen nystatin cream (MYCOSTATIN)     . traMADol (ULTRAM) 50 MG tablet      Current Facility-Administered Medications on File Prior to Visit  Medication Dose Route Frequency Provider Last Rate Last Dose  . gadopentetate dimeglumine (MAGNEVIST) injection 14 mL   14 mL Intravenous Once PRN Penumalli, Vikram R, MD      . gadopentetate dimeglumine (MAGNEVIST) injection 14 mL  14 mL Intravenous Once PRN Penumalli, Glenford Bayley, MD        Past Surgical History:  Procedure Laterality Date  . ROBOTIC ASSISTED LAP VAGINAL HYSTERECTOMY  11/2015   has ovaries    No Known Allergies  Social History   Social History  . Marital status: Married    Spouse name: Annette Stable  . Number of children: 2  . Years of education: 14   Occupational History  .      home maker   Social History Main Topics  . Smoking status: Never Smoker  . Smokeless tobacco: Never Used  . Alcohol use No  . Drug use: No  . Sexual activity: Not on file   Other Topics Concern  . Not on file   Social History Narrative   lives with husband    caffeine -diet coke once a week    Family History  Problem Relation Age of Onset  . Cancer Father   . Alzheimer's disease Father   . Multiple sclerosis Brother     BP 124/78   Pulse 98   Ht  (1.651 m)   Wt 152 lb (68.9 kg)   BMI 25.29 kg/m   Review of Systems: See HPI above.  Objective:  Physical Exam:  Gen: NAD, comfortable in exam room  Back: No gross deformity, scoliosis. Mild right paraspinal lumbar tenderness.  No midline or bony TTP. FROM. Strength LEs 5/5 all muscle groups.   2+ MSRs in patellar and achilles tendons, equal bilaterally. Negative SLRs. Sensation intact to light touch bilaterally. Negative logroll bilateral hips Negative fabers and piriformis stretches.  Assessment & Plan:  1. Low back pain with radiation into right leg - consistent with lumbar radiculopathy.  Mild improvement with prednisone but seemed transient.  Doing home exercises.  Tramadol and flexeril if needed.  Will go ahead with MRI to assess for source of radiculopathy.  Addendum:  MRI reviewed and discussed with patient.  While she has some facet arthritis there's no evidence of something to account for radicular symptoms into  right leg.  Consistent with lumbar strain with peripheral nerve irritation/sciatica.  She will start physical therapy and continue home exercises.  Follow up with [place] about 1 month to 6 weeks following start of PT.

## 2017-03-25 ENCOUNTER — Other Ambulatory Visit: Payer: BLUE CROSS/BLUE SHIELD

## 2017-03-26 ENCOUNTER — Ambulatory Visit
Admission: RE | Admit: 2017-03-26 | Discharge: 2017-03-26 | Disposition: A | Discharge status: Home / Self Care | Payer: BLUE CROSS/BLUE SHIELD | Source: Ambulatory Visit | Attending: Family Medicine | Admitting: Family Medicine

## 2017-03-26 DIAGNOSIS — M79604 Pain in right leg: Secondary | ICD-10-CM

## 2017-03-26 DIAGNOSIS — M545 Low back pain, unspecified: Secondary | ICD-10-CM

## 2017-03-29 NOTE — Addendum Note (Signed)
Addended by: Kathi Simpers F on: 03/29/2017 04:45 PM   Modules accepted: Orders

## 2017-04-06 ENCOUNTER — Ambulatory Visit: Payer: BLUE CROSS/BLUE SHIELD | Attending: Family Medicine | Admitting: Physical Therapy

## 2017-04-06 DIAGNOSIS — R29898 Other symptoms and signs involving the musculoskeletal system: Secondary | ICD-10-CM

## 2017-04-06 DIAGNOSIS — M545 Low back pain: Secondary | ICD-10-CM | POA: Insufficient documentation

## 2017-04-06 NOTE — Therapy (Signed)
[facility] [address]  [address] [place], [place], [address] Phone: [phone]   Fax:  [phone]  Physical Therapy Evaluation  Patient Details  Name: Shelley Armstrong MRN: [id number] Date of Birth: [birth year] Referring Provider: Dr. Pearletha Forge  Encounter Date: 04/06/2017      PT End of Session - 04/06/17 1056    Visit Number 1   Number of Visits 12   Date for PT Re-Evaluation 05/18/17   PT Start Time 0844   PT Stop Time 0922   PT Time Calculation (min) 38 min   Activity Tolerance Patient tolerated treatment well   Behavior During Therapy [facility] for tasks assessed/performed      Past Medical History:  Diagnosis Date  . Hypercholesterolemia     Past Surgical History:  Procedure Laterality Date  . ROBOTIC ASSISTED LAP VAGINAL HYSTERECTOMY  11/2015   has ovaries    There were no vitals filed for this visit.       Subjective Assessment - 04/06/17 0845    Subjective Patient reporting B LBP with pain radition into R LE. Symtpoms atarted approx 1.5 months ago - no known reasong. Patient runs daily - approx 16 miles a week - back hurts during and after. Reports standing helps some. Pain worse with sitting, driving, lying on one side.    Currently in Pain? Yes   Pain Score 6    Pain Location Back  B buttocks   Pain Orientation Lower   Pain Descriptors / Indicators Aching  intermittent stabbing   Pain Type Acute pain   Pain Onset More than a month ago   Pain Frequency Intermittent   Aggravating Factors  sitting, driving   Pain Relieving Factors temporary relief with biofreeze, heating pain            [facility] PT Assessment - 04/06/17 0849      Assessment   Medical Diagnosis LBP with pain radiating to R leg   Referring Provider Dr. Pearletha Forge   Onset Date/Surgical Date --  1-1.5 months ago   Next MD Visit prn   Prior Therapy no     Precautions   Precautions None     Restrictions   Weight Bearing Restrictions No      Balance Screen   Has the patient fallen in the past 6 months No   Has the patient had a decrease in activity level because of a fear of falling?  No   Is the patient reluctant to leave their home because of a fear of falling?  No     Home [employer]ist information centre manager residence     Prior Function   Level of Independence Independent   Vocation Unemployed   Leisure running - training for 1/2 marathon     Cognition   Overall Cognitive Status Within Functional Limits for tasks assessed     Observation/Other Assessments   Focus on Therapeutic Outcomes (FOTO)  Lumbar Spine: 94 (6% limited)     Sensation   Light Touch Appears Intact     Coordination   Gross Motor Movements are Fluid and Coordinated Yes     Posture/Postural Control   Posture/Postural Control Postural limitations   Postural Limitations Rounded Shoulders;Forward head     ROM / Strength   AROM / PROM / Strength AROM;Strength     AROM   Overall AROM Comments full motion all planes and symmetrical without pain - only stretching in low back   AROM  Assessment Site Lumbar     Strength   Strength Assessment Site Hip   Right/Left Hip Right;Left   Right Hip Extension 4-/5   Right Hip ABduction 4-/5   Left Hip Extension 4-/5   Left Hip ABduction 4/5     Flexibility   Soft Tissue Assessment /Muscle Length yes   Hamstrings B tightness with tendency to posterior pelvic tilt to achieve more motion     Palpation   Spinal mobility good mobility, however, slight pain from L2 and below   Palpation comment TTP along lumbar paraspinals and B glute/piriformis            Objective measurements completed on examination: See above findings.          [facility] Adult PT Treatment/Exercise - 04/06/17 0849      Exercises   Exercises Knee/Hip     Knee/Hip Exercises: Stretches   Passive Hamstring Stretch Right;Left;3 reps;30 seconds   Passive Hamstring Stretch Limitations supine with strap   Piriformis  Stretch Right;Left;3 reps;30 seconds   Piriformis Stretch Limitations supine figure 4     Knee/Hip Exercises: Supine   Bridges 10 reps   Bridges Limitations poor control with lowering phase     Knee/Hip Exercises: Sidelying   Hip ABduction Right;10 reps   Hip ABduction Limitations poor control during lowering phase                PT Education - 04/06/17 0917    Education provided Yes   Education Details exam findings, POC, HEP   Person(s) Educated Patient   Methods Explanation;Demonstration;Handout   Comprehension Verbalized understanding;Returned demonstration             PT Long Term Goals - 04/06/17 1151      PT LONG TERM GOAL #1   Title patient to be independent with advanced HEP   Status New   Target Date 05/18/17     PT LONG TERM GOAL #2   Title Patient to improve proximal hip strength to >/= 4+/5   Status New   Target Date 05/18/17     PT LONG TERM GOAL #3   Title patient to demonstrate good posture and body mechanics as it relates to her daily activities   Status New   Target Date 05/18/17     PT LONG TERM GOAL #4   Title Patient to report pain reduction by >/= 50%   Status New   Target Date 05/18/17                Plan - 04/06/17 1057    Clinical Impression Statement Ms. Cranmore is an active 46 y/o female presenting to OPPT today regarding low back pain with pain radiation into R LE. Patient with no known mechanism of injury, with time since onset approx 1-1.5 months. Patient today with good AROM at lumbar spine with all motions seemingly pain free and symmetrical. patient today however with some posterior chain tightness, reduced proximal hip strength, as well as slight pain with spinal mobility (CPAs). Patient today given initial HEP for gentle stretching and strengthening with good carryover. Patient to benefit from PT to address pain and functional mobility limitations for improved QOL.    Clinical Presentation Stable   Clinical Decision  Making Low   Rehab Potential Good   PT Frequency 2x / week   PT Duration 6 weeks   PT Treatment/Interventions ADLs/Self Care Home Management;Cryotherapy;Electrical Stimulation;Moist Heat;Traction;Ultrasound;Neuromuscular re-education;Balance training;Therapeutic exercise;Therapeutic activities;Functional mobility training;Patient/family education;Manual techniques;Passive range of motion;Vasopneumatic Device;Taping;Dry  needling   Consulted and Agree with Plan of Care Patient      Patient will benefit from skilled therapeutic intervention in order to improve the following deficits and impairments:  Pain, Decreased strength  Visit Diagnosis: Acute bilateral low back pain, with sciatica presence unspecified - Plan: PT plan of care cert/re-cert  Other symptoms and signs involving the musculoskeletal system - Plan: PT plan of care cert/re-cert     Problem List Patient Active Problem List   Diagnosis Date Noted  . Low back pain radiating to right leg 02/15/2017  . Hereditary and idiopathic peripheral neuropathy 04/19/2016  . Long term use of drug 08/13/2014  . Major depression, recurrent (HCC) 10/24/2013  . Generalized anxiety disorder 02/27/2013  . Hyperlipidemia 12/02/2012    Kipp Laurence, PT, DPT 04/06/17 11:56 AM   [facility] [address]  [address] [place], [place], [address] Phone: [phone]   Fax:  [phone]  Name: Angelita Harnack MRN: [id number] Date of Birth: 15-Oct-1970

## 2017-04-06 NOTE — Patient Instructions (Signed)
Hamstring Step 2   Left foot relaxed, knee straight, other leg bent, foot flat. Raise straight leg further upward to maximal range. Hold __30_ seconds. Relax leg completely down. Repeat _3__ times.  Piriformis Stretch, Supine   Lie supine, one ankle crossed onto opposite knee. Holding bottom leg behind knee, gently pull legs toward chest until stretch is felt in buttock of top leg. Hold __30_ seconds. For deeper stretch gently push top knee away from body.  Repeat _3__ times per session.   Bridge   Lie back, legs bent. Inhale, pressing hips up. Keeping ribs in, lengthen lower back. Exhale, rolling down along spine from top. Repeat __15__ times. Do __2__ sessions per day.  ABDUCTION: Side-Lying (Active)   Lie on left side, top leg straight. Raise top leg as far as possible.  Complete _2__ sets of __15_ repetitions.  Trigger Point Dry Needling  . What is Trigger Point Dry Needling (DN)? o DN is a physical therapy technique used to treat muscle pain and dysfunction. Specifically, DN helps deactivate muscle trigger points (muscle knots).  o A thin filiform needle is used to penetrate the skin and stimulate the underlying trigger point. The goal is for a local twitch response (LTR) to occur and for the trigger point to relax. No medication of any kind is injected during the procedure.   . What Does Trigger Point Dry Needling Feel Like?  o The procedure feels different for each individual patient. Some patients report that they do not actually feel the needle enter the skin and overall the process is not painful. Very mild bleeding may occur. However, many patients feel a deep cramping in the muscle in which the needle was inserted. This is the local twitch response.   Marland Kitchen How Will I feel after the treatment? o Soreness is normal, and the onset of soreness may not occur for a few hours. Typically this soreness does not last longer than two days.  o Bruising is uncommon, however; ice can be  used to decrease any possible bruising.  o In rare cases feeling tired or nauseous after the treatment is normal. In addition, your symptoms may get worse before they get better, this period will typically not last longer than 24 hours.   . What Can I do After My Treatment? o Increase your hydration by drinking more water for the next 24 hours. o You may place ice or heat on the areas treated that have become sore, however, do not use heat on inflamed or bruised areas. Heat often brings more relief post needling. o You can continue your regular activities, but vigorous activity is not recommended initially after the treatment for 24 hours. o DN is best combined with other physical therapy such as strengthening, stretching, and other therapies.

## 2017-04-09 IMAGING — XA DG FLUORO GUIDE LUMBAR PUNCTURE
1 series · 1 of 1 positions shown · non-contrast
Comparison: none

CLINICAL DATA: Numbness and tingling.  Abnormal brain MRI.

[Series 1: ortho standard · 1 of 1 slices shown]
[im 1/1]
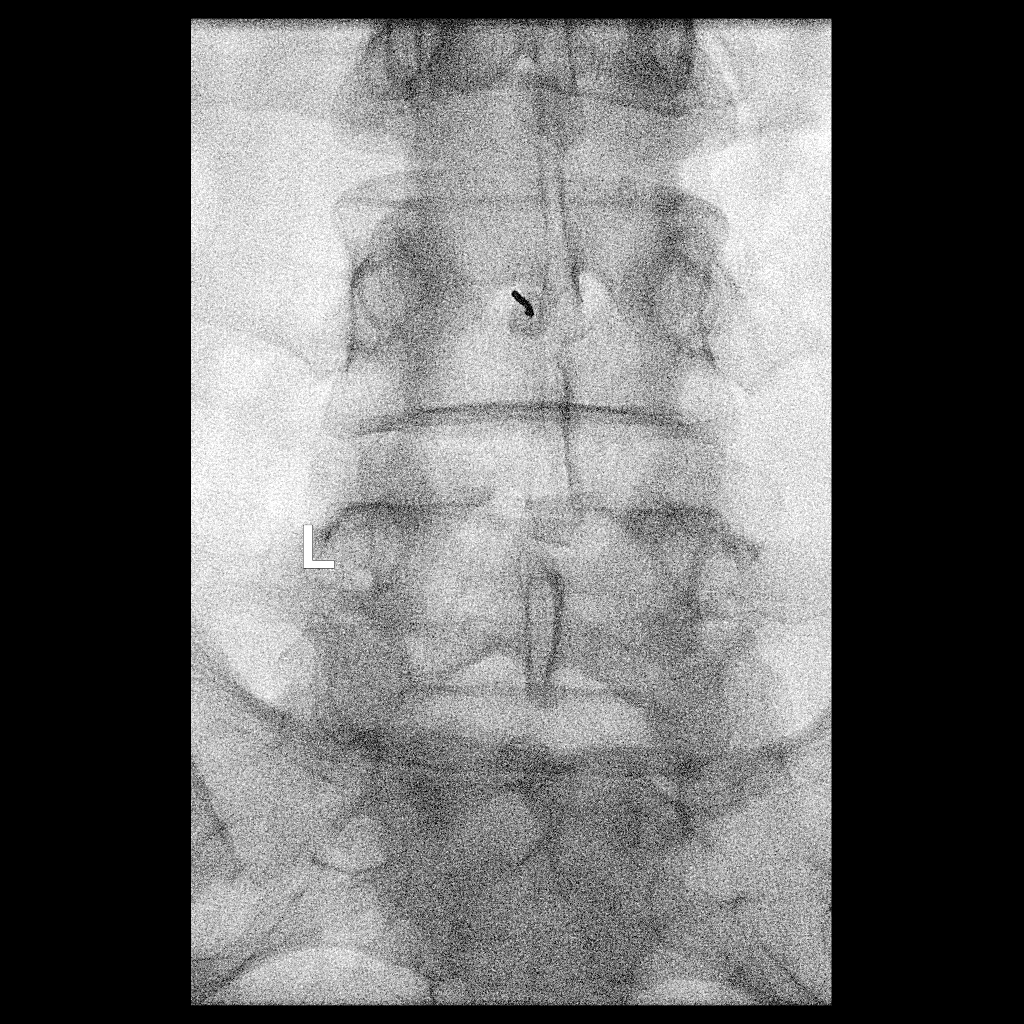

[1 of 1 positions shown; findings below may reference images not displayed]

EXAM:
DIAGNOSTIC LUMBAR PUNCTURE UNDER FLUOROSCOPIC GUIDANCE

FLUOROSCOPY TIME:  Fluoroscopy Time:  4 seconds

Radiation Exposure Index (if provided by the fluoroscopic device):
10.16 microGray*m^2

Number of Acquired Spot Images: 0

PROCEDURE:
Informed consent was obtained from the patient prior to the
procedure, including potential complications of headache, allergy,
and pain. With the patient prone, the lower back was prepped with
Betadine. 1% Lidocaine was used for local anesthesia. Lumbar
puncture was performed at the L3-4 level using a 3.5 inch 20 gauge
needle via a left paramedian approach with return of clear CSF with
an opening pressure of 17 cm water (measured in the left lateral
decubitus position). 12 ml of CSF were obtained for laboratory
studies. The patient tolerated the procedure well and there were no
apparent complications.
IMPRESSION: Successful fluoroscopic guided lumbar puncture.

## 2017-04-11 ENCOUNTER — Telehealth: Payer: Self-pay | Admitting: Family Medicine

## 2017-04-11 MED ORDER — CYCLOBENZAPRINE HCL 10 MG PO TABS: 10.0000 mg | ORAL_TABLET | Freq: Three times a day (TID) | ORAL | 1 refills | Status: DC | PRN

## 2017-04-11 NOTE — Telephone Encounter (Signed)
Patient called requesting a muscle relaxer for her back pain

## 2017-04-11 NOTE — Telephone Encounter (Signed)
Ok I sent it in for her - please let her know she can split it in half too if a full one makes her too drowsy.  Thanks!

## 2017-04-11 NOTE — Telephone Encounter (Signed)
Spoke with patient and she wants to do the Flexeril. Please send to pharmacy that is indicated in the chart.

## 2017-04-11 NOTE — Telephone Encounter (Signed)
It looks like she tried flexeril before per her chart.  Does she want to try this again?  If not, I can send in a different one.  And which pharmacy?  Thanks!

## 2017-04-11 NOTE — Telephone Encounter (Signed)
Spoke to patient and gave her information provided by the physician. 

## 2017-04-13 ENCOUNTER — Ambulatory Visit: Payer: BLUE CROSS/BLUE SHIELD | Admitting: Physical Therapy

## 2017-04-13 DIAGNOSIS — R29898 Other symptoms and signs involving the musculoskeletal system: Secondary | ICD-10-CM

## 2017-04-13 DIAGNOSIS — M545 Low back pain: Secondary | ICD-10-CM | POA: Diagnosis not present

## 2017-04-13 NOTE — Therapy (Addendum)
[facility] [address]  [place], [place], [address] Phone: [phone]   Fax:  [phone]  Physical Therapy Treatment  Patient Details  Name: Shelley Armstrong MRN: [id number] Date of Birth: 1970-12-15 Referring Provider: Dr. Barbaraann Barthel  Encounter Date: 04/13/2017      PT End of Session - 04/13/17 0848    Visit Number 2   Number of Visits 12   Date for PT Re-Evaluation 05/18/17   PT Start Time 0846   PT Stop Time 0931   PT Time Calculation (min) 45 min   Activity Tolerance Patient tolerated treatment well   Behavior During Therapy [facility] for tasks assessed/performed      Past Medical History:  Diagnosis Date  . Hypercholesterolemia     Past Surgical History:  Procedure Laterality Date  . ROBOTIC ASSISTED LAP VAGINAL HYSTERECTOMY  11/2015   has ovaries    There were no vitals filed for this visit.      Subjective Assessment - 04/13/17 0847    Subjective feeling well - feeling a little better   Patient Stated Goals improve pain, run without pain   Currently in Pain? Yes   Pain Score 5    Pain Location Back   Pain Orientation Lower   Pain Descriptors / Indicators Aching;Sore   Pain Type Acute pain                         [facility] Adult PT Treatment/Exercise - 04/13/17 0849      Knee/Hip Exercises: Stretches   Passive Hamstring Stretch Right;Left;2 reps;30 seconds   Passive Hamstring Stretch Limitations supine with strap   Other Knee/Hip Stretches foam roll to R glute/piriformis x 1 min; foam roll to R HS x 1 min     Knee/Hip Exercises: Aerobic   Recumbent Bike L2 x 6 min     Knee/Hip Exercises: Standing   Functional Squat 15 reps   Functional Squat Limitations TRX   Wall Squat 15 reps   Wall Squat Limitations with orange pball against wall   Other Standing Knee Exercises standing hip drop x 15 each side - difficulty with coordination   Other Standing Knee Exercises hip hinge 10 x 10  sec hold     Manual Therapy   Manual Therapy Soft tissue mobilization   Soft tissue mobilization STM to R glute/piriformis and R HS group          Trigger Point Dry Needling - 04/13/17 1045    Muscles Treated Lower Body Gluteus maximus;Piriformis;Hamstring   Gluteus Maximus Response Twitch response elicited;Palpable increased muscle length   Piriformis Response Twitch response elicited;Palpable increased muscle length   Hamstring Response Twitch response elicited;Palpable increased muscle length                   PT Long Term Goals - 04/13/17 0849      PT LONG TERM GOAL #1   Title patient to be independent with advanced HEP   Status On-going     PT LONG TERM GOAL #2   Title Patient to improve proximal hip strength to >/= 4+/5   Status On-going     PT LONG TERM GOAL #3   Title patient to demonstrate good posture and body mechanics as it relates to her daily activities   Status On-going     PT LONG TERM GOAL #4   Title Patient to report pain reduction by >/= 50%  Status On-going               Plan - 04/13/17 0849    Clinical Impression Statement Patient doing well today. Trial of DN to R glute/piriformis and R HS today wtih good twitche response noted. Patient doing well today with all strengthening and stretching based tasks with no increase in pain. Will continue to progress general strength and flexibility at upcoming visits.    PT Treatment/Interventions ADLs/Self Care Home Management;Cryotherapy;Electrical Stimulation;Moist Heat;Traction;Ultrasound;Neuromuscular re-education;Balance training;Therapeutic exercise;Therapeutic activities;Functional mobility training;Patient/family education;Manual techniques;Passive range of motion;Vasopneumatic Device;Taping;Dry needling   Consulted and Agree with Plan of Care Patient      Patient will benefit from skilled therapeutic intervention in order to improve the following deficits and impairments:  Pain,  Decreased strength  Visit Diagnosis: Acute bilateral low back pain, with sciatica presence unspecified  Other symptoms and signs involving the musculoskeletal system     Problem List Patient Active Problem List   Diagnosis Date Noted  . Low back pain radiating to right leg 02/15/2017  . Hereditary and idiopathic peripheral neuropathy 04/19/2016  . Long term use of drug 08/13/2014  . Major depression, recurrent ([place]) 10/24/2013  . Generalized anxiety disorder 02/27/2013  . Hyperlipidemia 12/02/2012     Lanney Gins, PT, DPT 04/13/17 10:48 AM  PHYSICAL THERAPY DISCHARGE SUMMARY  Visits from Start of Care: 2  Current functional level related to goals / functional outcomes: See above, good tolerance to all activities and manual therapy   Remaining deficits: See above   Education / Equipment: HEP  Plan: Patient agrees to discharge.  Patient goals were not met. Patient is being discharged due to not returning since the last visit.  ?????    Patient seemingly tolerant to all PT activities. Chart review revealing pain at thoracic and cervical spine with MD ordering MRI of these areas. Will gladly see patient in the future for any other needs.   Lanney Gins, PT, DPT 05/17/17 3:13 PM    [facility] [address]  [place], [place], [address] Phone: [phone]   Fax:  [phone]  Name: Shelley Armstrong MRN: [id number] Date of Birth: 1970-08-06

## 2017-04-13 NOTE — Patient Instructions (Signed)

## 2017-04-18 ENCOUNTER — Encounter: Payer: Self-pay | Admitting: Family Medicine

## 2017-04-18 ENCOUNTER — Ambulatory Visit (INDEPENDENT_AMBULATORY_CARE_PROVIDER_SITE_OTHER): Payer: BLUE CROSS/BLUE SHIELD | Admitting: Family Medicine

## 2017-04-18 VITALS — BP 125/75 | HR 91 | Ht 65.0 in | Wt 151.0 lb

## 2017-04-18 DIAGNOSIS — M545 Low back pain: Secondary | ICD-10-CM

## 2017-04-18 DIAGNOSIS — M549 Dorsalgia, unspecified: Secondary | ICD-10-CM | POA: Diagnosis not present

## 2017-04-18 DIAGNOSIS — M79604 Pain in right leg: Secondary | ICD-10-CM

## 2017-04-18 NOTE — Patient Instructions (Signed)
We will go ahead with MRIs of your cervical and thoracic spine given the progression of your symptoms to include mid back and low cervical area with radiation into right chest wall and right hand in addition to the right leg. Continue with the tylenol, muscle relaxant, can take topical medicine too (capsaicin, biofreeze). Ice or heat if needed 15 minutes at a time. Continue with physical therapy as well.

## 2017-04-20 ENCOUNTER — Ambulatory Visit: Payer: BLUE CROSS/BLUE SHIELD | Admitting: Physical Therapy

## 2017-04-21 DIAGNOSIS — M549 Dorsalgia, unspecified: Secondary | ICD-10-CM | POA: Insufficient documentation

## 2017-04-21 NOTE — Assessment & Plan Note (Signed)
Lumbar spine MRI without evidence source of radiculopathy.  She's had increased symptoms into thoracic and low cervical region - ? If source coming from either area - will go ahead with MRIs to further assess.  Continue tylenol, flexeril.  Ice/heat if needed.  Continue PT and home exercises in meantime.

## 2017-04-21 NOTE — Progress Notes (Signed)
PCP: Caffie Damme, MD  Subjective:   HPI: Patient is a 46 y.o. female here for back pain.  8/21: Patient reports about 2 weeks ago she started to get pain in middle of low back radiating into right leg to the calf. No acute injury or trauma. Pain sharp and up to 8/10 level. No numbness or tingling. No bowel/bladder dysfunction. Has tried tramadol and flexeril without much benefit. Had radiographs which I reviewed today - only mild low lumbar degenerative changes.  9/21: Patient reports she felt prednisone helped a little bit but not a lot. She is still doing home exercises and yoga. Takes tramadol or flexeril if needed and these help some. Pain level up to 7/10 and sharp. Radiates into right leg to about the foot now but not toes. No numbness or tingling. No bowel/bladder dysfunction.  10/22: Patient reports for the past 4 days she's had mid-back pain without new injury. She has been doing home exercises and physical therapy. Describes a burning pain in low back that is radiating down into right leg to foot. Associated numbness. Taking a muscle relaxant. Pain level is 7/10 and sharp. Worse with ambulation. No bowel/bladder dysfunction.  Past Medical History:  Diagnosis Date  . Hypercholesterolemia     Current Outpatient Prescriptions on File Prior to Visit  Medication Sig Dispense Refill  . cyclobenzaprine (FLEXERIL) 10 MG tablet Take 1 tablet (10 mg total) by mouth 3 (three) times daily as needed for muscle spasms. 60 tablet 1  . ferrous sulfate 325 (65 FE) MG EC tablet Take 325 mg by mouth 3 (three) times daily with meals.    . fexofenadine (ALLEGRA) 180 MG tablet 1 mg.    . fluticasone (FLONASE) 50 MCG/ACT nasal spray     . gabapentin (NEURONTIN) 300 MG capsule Take 1 capsule (300 mg total) by mouth 2 (two) times daily. 60 capsule 6  . Lactobacillus (PROBIOTIC ACIDOPHILUS PO) Take by mouth. 90 billion daily    . montelukast (SINGULAIR) 10 MG tablet Take 10 mg by  mouth.    . Multiple Vitamin (MULTIVITAMIN) tablet Take 1 tablet by mouth daily.    Marland Kitchen tolterodine (DETROL LA) 4 MG 24 hr capsule      Current Facility-Administered Medications on File Prior to Visit  Medication Dose Route Frequency Provider Last Rate Last Dose  . gadopentetate dimeglumine (MAGNEVIST) injection 14 mL  14 mL Intravenous Once PRN Penumalli, Vikram R, MD      . gadopentetate dimeglumine (MAGNEVIST) injection 14 mL  14 mL Intravenous Once PRN Penumalli, Glenford Bayley, MD        Past Surgical History:  Procedure Laterality Date  . ROBOTIC ASSISTED LAP VAGINAL HYSTERECTOMY  11/2015   has ovaries    No Known Allergies  Social History   Social History  . Marital status: Married    Spouse name: Annette Stable  . Number of children: 2  . Years of education: 14   Occupational History  .      home maker   Social History Main Topics  . Smoking status: Never Smoker  . Smokeless tobacco: Never Used  . Alcohol use No  . Drug use: No  . Sexual activity: Not on file   Other Topics Concern  . Not on file   Social History Narrative   lives with husband    caffeine -diet coke once a week    Family History  Problem Relation Age of Onset  . Cancer Father   . Alzheimer's disease Father   .  Multiple sclerosis Brother     BP 125/75   Pulse 91   Ht 5\' 5"  (1.651 m)   Wt 151 lb (68.5 kg)   BMI 25.13 kg/m   Review of Systems: See HPI above.     Objective:  Physical Exam:  Gen: NAD, comfortable in exam room.  Neck: No gross deformity, swelling, bruising. Mild paraspinal low cervical tenderness.  No midline/bony TTP. FROM without pain. BUE strength 5/5.   Sensation intact to light touch.   2+ equal reflexes in triceps, biceps, brachioradialis tendons. Negative spurlings. NV intact distal BUEs.  Back: No gross deformity, scoliosis. TTP right > left paraspinal thoracic and lumbar regions.  No midline or bony TTP. FROM. Strength LEs 5/5 all muscle groups.   2+ MSRs in  patellar and achilles tendons, equal bilaterally. Negative SLRs. Sensation intact to light touch bilaterally. Negative logroll bilateral hips Negative fabers and piriformis stretches.  Assessment & Plan:  1. Back pain with radiation into right leg - Lumbar spine MRI without evidence source of radiculopathy.  She's had increased symptoms into thoracic and low cervical region - ? If source coming from either area - will go ahead with MRIs to further assess.  Continue tylenol, flexeril.  Ice/heat if needed.  Continue PT and home exercises in meantime.

## 2017-04-29 ENCOUNTER — Ambulatory Visit: Payer: BLUE CROSS/BLUE SHIELD | Admitting: Physical Therapy

## 2017-05-02 ENCOUNTER — Other Ambulatory Visit: Payer: BLUE CROSS/BLUE SHIELD

## 2017-07-11 ENCOUNTER — Telehealth: Payer: Self-pay | Admitting: Family Medicine

## 2017-07-11 NOTE — Telephone Encounter (Signed)
Patient was informed.

## 2017-07-11 NOTE — Telephone Encounter (Signed)
Patient called regarding physical therapy for her right side. The referral has expired and she would like a new one to be seen downstairs.   Does patient need a follow up visit before a new referral can be placed?

## 2017-07-11 NOTE — Telephone Encounter (Signed)
I think she can restart therapy without being seen by me but I'd like to see her back after she's done a month of PT.

## 2017-07-11 NOTE — Addendum Note (Signed)
Addended by: Kathi SimpersWISE, Dondrea Clendenin F on: 07/11/2017 04:55 PM   Modules accepted: Orders

## 2017-07-21 ENCOUNTER — Other Ambulatory Visit: Payer: Self-pay | Admitting: Obstetrics and Gynecology

## 2017-12-08 ENCOUNTER — Other Ambulatory Visit (HOSPITAL_BASED_OUTPATIENT_CLINIC_OR_DEPARTMENT_OTHER): Payer: Self-pay | Admitting: Osteopathic Medicine

## 2017-12-08 ENCOUNTER — Ambulatory Visit (HOSPITAL_BASED_OUTPATIENT_CLINIC_OR_DEPARTMENT_OTHER)
Admission: RE | Admit: 2017-12-08 | Discharge: 2017-12-08 | Disposition: A | Discharge status: Home / Self Care | Payer: BLUE CROSS/BLUE SHIELD | Source: Ambulatory Visit | Attending: Osteopathic Medicine | Admitting: Osteopathic Medicine

## 2017-12-08 DIAGNOSIS — R52 Pain, unspecified: Secondary | ICD-10-CM | POA: Diagnosis present

## 2017-12-08 DIAGNOSIS — E041 Nontoxic single thyroid nodule: Secondary | ICD-10-CM | POA: Insufficient documentation

## 2017-12-08 DIAGNOSIS — E049 Nontoxic goiter, unspecified: Secondary | ICD-10-CM | POA: Diagnosis present

## 2018-01-02 DIAGNOSIS — E040 Nontoxic diffuse goiter: Secondary | ICD-10-CM | POA: Insufficient documentation

## 2018-01-19 DIAGNOSIS — R09A2 Foreign body sensation, throat: Secondary | ICD-10-CM | POA: Insufficient documentation

## 2018-03-20 ENCOUNTER — Telehealth: Payer: Self-pay | Admitting: Family Medicine

## 2018-03-20 MED ORDER — CYCLOBENZAPRINE HCL 10 MG PO TABS: 10.0000 mg | ORAL_TABLET | Freq: Three times a day (TID) | ORAL | 0 refills | Status: DC | PRN

## 2018-03-20 NOTE — Telephone Encounter (Signed)
I can give her one more refill since it's been 11 months since I've seen her - if she needs more after that I'll have to see her.  Thanks!

## 2018-03-20 NOTE — Telephone Encounter (Signed)
Patient was informed.

## 2018-03-20 NOTE — Telephone Encounter (Signed)
Patient requesting refill of cyclobenzaprine. States she only takes one pill a day to help with spasms after running.   [facility] [place]

## 2018-04-20 ENCOUNTER — Ambulatory Visit: Payer: BLUE CROSS/BLUE SHIELD | Admitting: Family Medicine

## 2018-07-13 ENCOUNTER — Other Ambulatory Visit: Payer: Self-pay | Admitting: Internal Medicine

## 2018-07-13 DIAGNOSIS — M5136 Other intervertebral disc degeneration, lumbar region: Secondary | ICD-10-CM

## 2018-07-18 ENCOUNTER — Ambulatory Visit
Admission: RE | Admit: 2018-07-18 | Discharge: 2018-07-18 | Disposition: A | Discharge status: Home / Self Care | Payer: BLUE CROSS/BLUE SHIELD | Source: Ambulatory Visit | Attending: Internal Medicine | Admitting: Internal Medicine

## 2018-07-18 DIAGNOSIS — M5136 Other intervertebral disc degeneration, lumbar region: Secondary | ICD-10-CM

## 2018-08-14 ENCOUNTER — Other Ambulatory Visit: Payer: Self-pay | Admitting: Family Medicine

## 2018-08-23 ENCOUNTER — Ambulatory Visit: Payer: BLUE CROSS/BLUE SHIELD | Admitting: Family Medicine

## 2018-08-23 ENCOUNTER — Encounter: Payer: Self-pay | Admitting: Family Medicine

## 2018-08-23 VITALS — BP 120/85 | HR 92 | Ht 66.0 in | Wt 163.8 lb

## 2018-08-23 DIAGNOSIS — M545 Low back pain, unspecified: Secondary | ICD-10-CM

## 2018-08-23 MED ORDER — CYCLOBENZAPRINE HCL 10 MG PO TABS: 10.0000 mg | ORAL_TABLET | Freq: Three times a day (TID) | ORAL | 1 refills | Status: DC | PRN

## 2018-08-23 NOTE — Patient Instructions (Signed)
I would recommend going ahead with an MRI of your thoracic spine as the next step. Your lumbar spine does not have a pinched nerve to account for your symptoms - you have mild disc bulging that is normal for age but this is not causing your symptoms. If this is normal I would return to seeing the neurologist I've sent the flexeril in for you. Follow up will depend on the MRI.

## 2018-08-24 ENCOUNTER — Other Ambulatory Visit: Payer: Self-pay | Admitting: Family Medicine

## 2018-08-25 ENCOUNTER — Encounter: Payer: Self-pay | Admitting: Family Medicine

## 2018-08-25 ENCOUNTER — Ambulatory Visit
Admission: RE | Admit: 2018-08-25 | Discharge: 2018-08-25 | Disposition: A | Discharge status: Home / Self Care | Payer: BLUE CROSS/BLUE SHIELD | Source: Ambulatory Visit | Attending: Family Medicine | Admitting: Family Medicine

## 2018-08-25 DIAGNOSIS — M545 Low back pain, unspecified: Secondary | ICD-10-CM

## 2018-08-25 NOTE — Progress Notes (Signed)
PCP: Caffie Damme, MD  Subjective:   HPI: Patient is a 47 y.o. female here for low back pain.  Patient was last seen 03/2017 for low back pain with radiation more to the right side into the leg. Pain progressed to include mid-back, she's been diligent with home exercises. She maybe improved some but more recently pain is worse at 6/10 level. She reports pain more on the left side of her mid-low back with radiation into left leg with burning. She had recent MRI of lumbar spine performed 07/18/2018 that did not show evidence of nerve impingement to account for her pain. She takes flexeril as needed which helps. No bowel/bladder dysfunction. No facial numbness, weakness, speech changes, vision changes, upper extremity symptoms. She saw neurology at the end of 2017 with normal LP following a brain MRI showing scattered T2 hyperintensities - they discussed possibility of follow-up in 3-6 months though this was when she had facial symptoms.  Past Medical History:  Diagnosis Date  . Hypercholesterolemia     Current Outpatient Medications on File Prior to Visit  Medication Sig Dispense Refill  . ferrous sulfate 325 (65 FE) MG EC tablet Take 325 mg by mouth 3 (three) times daily with meals.    . fexofenadine (ALLEGRA) 180 MG tablet 1 mg.    . fluticasone (FLONASE) 50 MCG/ACT nasal spray     . gabapentin (NEURONTIN) 300 MG capsule Take 1 capsule (300 mg total) by mouth 2 (two) times daily. 60 capsule 6  . Lactobacillus (PROBIOTIC ACIDOPHILUS PO) Take by mouth. 90 billion daily    . montelukast (SINGULAIR) 10 MG tablet Take 10 mg by mouth.    . Multiple Vitamin (MULTIVITAMIN) tablet Take 1 tablet by mouth daily.    . pantoprazole (PROTONIX) 20 MG tablet     . tolterodine (DETROL LA) 4 MG 24 hr capsule      Current Facility-Administered Medications on File Prior to Visit  Medication Dose Route Frequency Provider Last Rate Last Dose  . gadopentetate dimeglumine (MAGNEVIST) injection 14 mL  14  mL Intravenous Once PRN Penumalli, Vikram R, MD      . gadopentetate dimeglumine (MAGNEVIST) injection 14 mL  14 mL Intravenous Once PRN Penumalli, Glenford Bayley, MD        Past Surgical History:  Procedure Laterality Date  . ROBOTIC ASSISTED LAP VAGINAL HYSTERECTOMY  11/2015   has ovaries    No Known Allergies  Social History   Socioeconomic History  . Marital status: Married    Spouse name: Annette Stable  . Number of children: 2  . Years of education: 64  . 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 Financial resource strain: Not on file  . Food insecurity:    Worry: Not on file    Inability: Not on file  . Transportation needs:    Medical: Not on file    Non-medical: Not on file  Tobacco Use  . Smoking status: Never Smoker  . Smokeless tobacco: Never Used  Substance and Sexual Activity  . Alcohol use: No  . Drug use: No  . Sexual activity: Not on file  Lifestyle  . Physical activity:    Days per week: Not on file    Minutes per session: Not on file  . Stress: Not on file  Relationships  . Social connections:    Talks on phone: Not on file    Gets together: Not on file  Attends religious service: Not on file    Active member of club or organization: Not on file    Attends meetings of clubs or organizations: Not on file    Relationship status: Not on file  . Intimate partner violence:    Fear of current or ex partner: Not on file    Emotionally abused: Not on file    Physically abused: Not on file    Forced sexual activity: Not on file  Other Topics Concern  . Not on file  Social History Narrative   lives with husband    caffeine -diet coke once a week    Family History  Problem Relation Age of Onset  . Cancer Father   . Alzheimer's disease Father   . Multiple sclerosis Brother     BP 120/85   Pulse 92   Ht 5\' 6"  (1.676 m)   Wt 163 lb 12.8 oz (74.3 kg)   BMI 26.44 kg/m   Review of Systems: See  HPI above.     Objective:  Physical Exam:  Gen: NAD, comfortable in exam room  Back: No gross deformity, scoliosis. Very mild tenderness left thoracic, lumbar regions.  No midline or bony TTP. FROM. Strength LEs 5/5 all muscle groups.   2+ MSRs in patellar and achilles tendons, equal bilaterally. Negative SLRs. Sensation intact to light touch bilaterally.  Left hip: No deformity. FROM with 5/5 strength. No tenderness to palpation. NVI distally. Negative logroll Negative fabers and piriformis stretches.   Assessment & Plan:  1. Mid-low back pain with radiation into left leg - independently reviewed lumbar MRI and no evidence radiculopathy - normal degenerative changes for age.  Given pain seems localized to left side of back and includes thoracic area with radiation into left leg now instead of the right, we will go ahead with MRI of thoracic spine - reviewed one she had 04/2016 that appeared normal except for some signal in the T5 vertebra on T2 images.  Advised if this is normal I would recommend reevaluation by neurology.  Flexeril as needed.  F/u dependent on MRI.

## 2018-08-28 ENCOUNTER — Encounter: Payer: Self-pay | Admitting: Internal Medicine

## 2018-08-29 NOTE — Addendum Note (Signed)
Addended by: Kathi Simpers F on: 08/29/2018 10:29 AM   Modules accepted: Orders

## 2018-08-30 ENCOUNTER — Encounter: Payer: Self-pay | Admitting: Diagnostic Neuroimaging

## 2018-08-30 ENCOUNTER — Ambulatory Visit: Payer: BLUE CROSS/BLUE SHIELD | Admitting: Diagnostic Neuroimaging

## 2018-08-30 VITALS — BP 122/78 | HR 97 | Ht 65.5 in | Wt 166.4 lb

## 2018-08-30 DIAGNOSIS — G8929 Other chronic pain: Secondary | ICD-10-CM

## 2018-08-30 DIAGNOSIS — M5442 Lumbago with sciatica, left side: Secondary | ICD-10-CM

## 2018-08-30 NOTE — Progress Notes (Signed)
[facility]  PATIENT: Shelley Armstrong DOB: 16-Feb-1971  REFERRING CLINICIAN: Audelia Acton Hudnall HISTORY FROM: patient  REASON FOR VISIT: follow up   HISTORICAL  CHIEF COMPLAINT:  Chief Complaint  Patient presents with  . Pain    rm 7, "mid back pain down buttocks, hips and down left leg, tingling, burning; sometimes tingling in my hands; have had 1 injection in back which helped"    HISTORY OF PRESENT ILLNESS:   UPDATE (08/30/18, VRP): Since last visit, was doing well until Jan 2020. Now with pain in low back radiating to the left leg. Pain is present at rest and worsens with running. Avg running sessions 4 days per week. Symptoms are persistent. Severity is moderate. Has had similar problem affecting right leg in 2018.   UPDATE 06/02/16: Since last visit, doing about the same. Still with numbness and tingling and headaches.  PRIOR HPI (05/11/16): 48 year old female here for evaluation of numbness and tingling. 2-3 months ago patient onset of numbness and tingling in the fingers, feet, neck, lips and mouth. Symptoms fluctuate but last hours at a time. Symptoms are worse in the evening. Patient denies any weakness, balance or gait difficulties. Patient initially went to PCP in [facility] clinics. She had MRI of the cervical spine which showed disc bulge at C6-7 level and was treated with epidural steroid injection. Symptoms have slightly improved but continue. Also having increasing headaches. Patient has long history of anxiety since age 82 years old. She is not been on medication for the past 5 years. Anxiety is mild under control. Patient has snoring and has been diagnosed with sleep apnea in the past but is not on CPAP therapy. Patient having increasing chills and sweats. Patient reports normal B12, thyroid function and diabetes testing per PCP.   REVIEW OF SYSTEMS: Full 14 system review of systems performed and negative with exception of: pain sweating.   ALLERGIES: No  Known Allergies  HOME MEDICATIONS: Outpatient Medications Prior to Visit  Medication Sig Dispense Refill  . albuterol (PROVENTIL HFA;VENTOLIN HFA) 108 (90 Base) MCG/ACT inhaler Inhale into the lungs every 6 (six) hours as needed for wheezing or shortness of breath.    . beclomethasone (QVAR) 40 MCG/ACT inhaler Inhale 1 puff into the lungs 2 (two) times daily.    . cyclobenzaprine (FLEXERIL) 10 MG tablet Take 1 tablet (10 mg total) by mouth 3 (three) times daily as needed for muscle spasms. 60 tablet 1  . fexofenadine (ALLEGRA) 180 MG tablet 1 mg.    . fluticasone (FLONASE) 50 MCG/ACT nasal spray     . ibuprofen (ADVIL,MOTRIN) 200 MG tablet Take 200 mg by mouth every 6 (six) hours as needed.    Marland Kitchen OVER THE COUNTER MEDICATION Calm aid, lavendar as needed    . OVER THE COUNTER MEDICATION estevera    . propranolol (INDERAL) 10 MG tablet 10 mg 2 (two) times daily.    . vitamin B-12 (CYANOCOBALAMIN) 1000 MCG tablet Take 1,000 mcg by mouth daily.    . Vitamin D, Ergocalciferol, (DRISDOL) 1.25 MG (50000 UT) CAPS capsule weekly    . ferrous sulfate 325 (65 FE) MG EC tablet Take 325 mg by mouth 3 (three) times daily with meals.    . gabapentin (NEURONTIN) 300 MG capsule Take 1 capsule (300 mg total) by mouth 2 (two) times daily. 60 capsule 6  . Lactobacillus (PROBIOTIC ACIDOPHILUS PO) Take by mouth. 90 billion daily    . montelukast (SINGULAIR) 10 MG tablet Take 10  mg by mouth.    . Multiple Vitamin (MULTIVITAMIN) tablet Take 1 tablet by mouth daily.    . pantoprazole (PROTONIX) 20 MG tablet     . tolterodine (DETROL LA) 4 MG 24 hr capsule      Facility-Administered Medications Prior to Visit  Medication Dose Route Frequency Provider Last Rate Last Dose  . gadopentetate dimeglumine (MAGNEVIST) injection 14 mL  14 mL Intravenous Once PRN Penumalli, Vikram R, MD      . gadopentetate dimeglumine (MAGNEVIST) injection 14 mL  14 mL Intravenous Once PRN Penumalli, Earlean Polka, MD        PAST MEDICAL  HISTORY: Past Medical History:  Diagnosis Date  . Hypercholesterolemia     PAST SURGICAL HISTORY: Past Surgical History:  Procedure Laterality Date  . ROBOTIC ASSISTED LAP VAGINAL HYSTERECTOMY  11/2015   has ovaries    FAMILY HISTORY: Family History  Problem Relation Age of Onset  . Cancer Father   . Alzheimer's disease Father   . Multiple sclerosis Brother     SOCIAL HISTORY:  Social History   Socioeconomic History  . Marital status: Married    Spouse name: Rush Landmark  . Number of children: 2  . Years of education: 56  . 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 Financial resource strain: Not on file  . Food insecurity:    Worry: Not on file    Inability: Not on file  . Transportation needs:    Medical: Not on file    Non-medical: Not on file  Tobacco Use  . Smoking status: Never Smoker  . Smokeless tobacco: Never Used  Substance and Sexual Activity  . Alcohol use: No  . Drug use: No  . Sexual activity: Not on file  Lifestyle  . Physical activity:    Days per week: Not on file    Minutes per session: Not on file  . Stress: Not on file  Relationships  . Social connections:    Talks on phone: Not on file    Gets together: Not on file    Attends religious service: Not on file    Active member of club or organization: Not on file    Attends meetings of clubs or organizations: Not on file    Relationship status: Not on file  . Intimate partner violence:    Fear of current or ex partner: Not on file    Emotionally abused: Not on file    Physically abused: Not on file    Forced sexual activity: Not on file  Other Topics Concern  . Not on file  Social History Narrative   08/30/18 lives with husband    caffeine -diet coke once a week     PHYSICAL EXAM  GENERAL EXAM/CONSTITUTIONAL: Vitals:  Vitals:   08/30/18 1307  BP: 122/78  Pulse: 97  Weight: 166 lb 6.4 oz (75.5 kg)  Height: 5' 5.5" (1.664 m)    Body mass index is 27.27 kg/m. No exam data present  Patient is in no distress; well developed, nourished and groomed; neck is supple  CARDIOVASCULAR:  Examination of carotid arteries is normal; no carotid bruits  Regular rate and rhythm, no murmurs  Examination of peripheral vascular system by observation and palpation is normal  EYES:  Ophthalmoscopic exam of optic discs and posterior segments is normal; no papilledema or hemorrhages  MUSCULOSKELETAL:  Gait, strength, tone, movements noted in Neurologic exam below  NEUROLOGIC: MENTAL STATUS:  No flowsheet data found.  awake, alert, oriented to person, place and time  recent and remote memory intact  normal attention and concentration  language fluent, comprehension intact, naming intact,   fund of knowledge appropriate  CRANIAL NERVE:   2nd - no papilledema on fundoscopic exam  2nd, 3rd, 4th, 6th - pupils equal and reactive to light, visual fields full to confrontation, extraocular muscles intact, no nystagmus  5th - facial sensation symmetric  7th - facial strength symmetric  8th - hearing intact  9th - palate elevates symmetrically, uvula midline  11th - shoulder shrug symmetric  12th - tongue protrusion midline  INTERMITTENT LEFT FACIAL TIC  MOTOR:   normal bulk and tone, full strength in the BUE, BLE  SENSORY:   normal and symmetric to light touch, temperature, vibration  COORDINATION:   finger-nose-finger, fine finger movements normal  REFLEXES:   deep tendon reflexes present and symmetric  GAIT/STATION:   narrow based gait; able to walk tandem    DIAGNOSTIC DATA (LABS, IMAGING, TESTING) - I reviewed patient records, labs, notes, testing and imaging myself where available.  No results found for: WBC, HGB, HCT, MCV, PLT No results found for: NA, K, CL, CO2, GLUCOSE, BUN, CREATININE, CALCIUM, PROT, ALBUMIN, AST, ALT, ALKPHOS, BILITOT, GFRNONAA, GFRAA No results found for:  CHOL, HDL, LDLCALC, LDLDIRECT, TRIG, CHOLHDL No results found for: HGBA1C No results found for: VITAMINB12 No results found for: TSH  04/25/16 MRI cervical spine [I reviewed images myself and agree with interpretation. This is likely an incidental finding as patient has more widespread tingling sensations. -VRP]  - At C6-7: moderate left foraminal stenosis due to disc bulging and osteophyte  05/12/16 MRI brain (with and without) [I reviewed images myself and agree with interpretation. -VRP]  1. Mild scattered round and ovoid, periventricular and subcortical T2 hyperintensities. These findings are non-specific and considerations include autoimmune, inflammatory, post-infectious, microvascular ischemic or migraine associated etiologies.  2. No abnormal lesions are seen on post contrast views.   3. No acute findings.  05/26/16 MRI thoracic spine [I reviewed images myself and agree with interpretation. -VRP]  - normal   05/18/16 Labs: ANA, hypercoag panel, ESR, ACE, lyme, HIV, RPR, ANA, APAS - all negative  06/08/16 CSF - OCB zero; IgG synthesis / index normal - WBC 2, RBC 0, prot 33, glucose 61  08/25/18 MRI thoracic spine [I reviewed images myself and agree with interpretation. -VRP]  - No finding to explain the patient's symptoms. - No change in a shallow central protrusion at T5-6 without central canal or foraminal narrowing. - Very shallow broad-based right paracentral protrusion at T7-8 is new since the prior MRI but the central canal and foramina are widely patent.  07/18/18 MRI lumbar spine [I reviewed images myself and agree with interpretation. -VRP]  1. Borderline left subarticular lateral recess stenosis at L4-5 due to disc bulge and mild facet arthropathy, not appreciably increased from prior, an without overt impingement. 2. Mild disc bulges at L3-4 and L5-S1 are present without impingement.     ASSESSMENT AND PLAN  48 y.o. year old female here with:  Dx:  1. Chronic  left-sided low back pain with left-sided sciatica     PLAN:  LEFT LOW BACK PAIN --> LEFT LEG (suspect overtraining syndrome; ddx: IT band syndrome, lumbar radiculitis, piriformis syndrome) - conservative mgmt (nutrition, exercise, sleep, stress mgmt)  Return for return to Dr. Barbaraann Barthel.   Penni Bombard, MD 4/0/9811, 9:14 PM Certified in  Neurology, Neurophysiology and Neuroimaging  [facility] [address], [place], [place] [address] [phone]

## 2019-06-11 ENCOUNTER — Ambulatory Visit: Payer: Self-pay

## 2019-06-11 ENCOUNTER — Ambulatory Visit: Payer: BC Managed Care – PPO | Admitting: Family Medicine

## 2019-06-11 ENCOUNTER — Encounter: Payer: Self-pay | Admitting: Family Medicine

## 2019-06-11 ENCOUNTER — Other Ambulatory Visit: Payer: Self-pay

## 2019-06-11 VITALS — BP 125/84 | HR 88 | Ht 66.0 in | Wt 168.0 lb

## 2019-06-11 DIAGNOSIS — M79671 Pain in right foot: Secondary | ICD-10-CM

## 2019-06-11 MED ORDER — PREDNISONE 5 MG PO TABS: ORAL_TABLET | ORAL | 0 refills | Status: DC

## 2019-06-11 NOTE — Patient Instructions (Signed)
Nice to meet you Please try ice on the area Please try the medicine   Please send me a message in MyChart with any questions or updates.  Please see me back in 4 weeks or sooner if needed .   --Dr. Raeford Razor

## 2019-06-11 NOTE — Progress Notes (Signed)
Shelley Armstrong - 48 y.o. female MRN [id number]  Date of birth: 12-09-1970  SUBJECTIVE:  Including CC & ROS.  Chief Complaint  Patient presents with  . Foot Pain    right foot x 5 days    Shelley Armstrong is a 48 y.o. female that is presenting with right lateral midfoot pain.  The pain is been ongoing for about a week.  She experiences the pain mainly at night.  She does experience the pain through the course of the day.  It seems to be localized to the dorsal midfoot.  Denies any specific inciting event.  She runs fairly often.  She runs roughly 9 to 15 miles per week.  Has not changed her shoes.  Has not run on a different courses.  Denies any history of stress fracture.  No previous history of surgery.   Review of Systems  Constitutional: Negative for fever.  HENT: Negative for congestion.   Respiratory: Negative for cough.   Cardiovascular: Negative for chest pain.  Gastrointestinal: Negative for abdominal pain.  Musculoskeletal: Negative for gait problem.  Skin: Negative for color change.  Neurological: Negative for weakness.  Hematological: Negative for adenopathy.    HISTORY: Past Medical, Surgical, Social, and Family History Reviewed & Updated per EMR.   Pertinent Historical Findings include:  Past Medical History:  Diagnosis Date  . Hypercholesterolemia     Past Surgical History:  Procedure Laterality Date  . ROBOTIC ASSISTED LAP VAGINAL HYSTERECTOMY  11/2015   has ovaries    No Known Allergies  Family History  Problem Relation Age of Onset  . Cancer Father   . Alzheimer's disease Father   . Multiple sclerosis Brother      Social History   Socioeconomic History  . Marital status: Married    Spouse name: Annette Stable  . Number of children: 2  . Years of education: 32  . 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 Smoking status: Never Smoker  . Smokeless tobacco: Never Used  Substance and Sexual Activity  . Alcohol  use: No  . Drug use: No  . Sexual activity: Not on file  Other Topics Concern  . Not on file  Social History Narrative   08/30/18 lives with husband    caffeine -diet coke once a week   Social Determinants of Health   Financial Resource Strain:   . Difficulty of Paying Living Expenses: Not on file  Food Insecurity:   . Worried About Programme researcher, broadcasting/film/video in the Last Year: Not on file  . Ran Out of Food in the Last Year: Not on file  Transportation Needs:   . Lack of Transportation (Medical): Not on file  . Lack of Transportation (Non-Medical): Not on file  Physical Activity:   . Days of Exercise per Week: Not on file  . Minutes of Exercise per Session: Not on file  Stress:   . Feeling of Stress : Not on file  Social Connections:   . Frequency of Communication with Friends and Family: Not on file  . Frequency of Social Gatherings with Friends and Family: Not on file  . Attends Religious Services: Not on file  . Active Member of Clubs or Organizations: Not on file  . Attends Banker Meetings: Not on file  . Marital Status: Not on file  Intimate Partner Violence:   . Fear of Current or Ex-Partner: Not on file  . Emotionally  Abused: Not on file  . Physically Abused: Not on file  . Sexually Abused: Not on file     PHYSICAL EXAM:  VS: BP 125/84   Pulse 88   Ht 5\' 6"  (1.676 m)   Wt 168 lb (76.2 kg)   BMI 27.12 kg/m  Physical Exam Gen: NAD, alert, cooperative with exam, well-appearing ENT: normal lips, normal nasal mucosa,  Eye: normal EOM, normal conjunctiva and lids CV:  no edema, +2 pedal pulses   Resp: no accessory muscle use, non-labored,  Skin: no rashes, no areas of induration  Neuro: normal tone, normal sensation to touch Psych:  normal insight, alert and oriented MSK:  Right foot: Tenderness to palpation over the base of the fourth metatarsal. No ecchymosis or swelling. Normal strength resistance. Has a well-maintained longitudinal arch. Loss of  the transverse arch. Pinky toe significantly short compared to the other digits. Neurovascularly intact  Limited ultrasound: Right foot:  Normal-appearing peroneal tendons at the lateral malleolus. Normal-appearing insertion of the peroneal brevis into the base of the fifth. Normal-appearing base of the fifth metatarsal and fourth metatarsal. The interdigit webspace between the base of the fourth and fifth has increased hyperemia and an effusion in this area.  This was not seen on the contralateral side.   Summary: Effusion of the joint space of the fourth base metatarsal  Ultrasound and interpretation by Clearance Coots, MD    ASSESSMENT & PLAN:   Right foot pain There appears to be an effusion at the base of the fourth metatarsal.  I do not appreciate a stress fracture.  Could just be a capsular irritation in this area. -Prednisone. -Counseled on home exercise therapy and supportive care. -If no improvement will consider injection, imaging, or may need a postop shoe or boot.

## 2019-06-11 NOTE — Assessment & Plan Note (Signed)
There appears to be an effusion at the base of the fourth metatarsal.  I do not appreciate a stress fracture.  Could just be a capsular irritation in this area. -Prednisone. -Counseled on home exercise therapy and supportive care. -If no improvement will consider injection, imaging, or may need a postop shoe or boot.

## 2019-12-03 ENCOUNTER — Other Ambulatory Visit: Payer: Self-pay

## 2019-12-03 ENCOUNTER — Ambulatory Visit: Payer: BC Managed Care – PPO | Admitting: Family Medicine

## 2019-12-03 ENCOUNTER — Ambulatory Visit (INDEPENDENT_AMBULATORY_CARE_PROVIDER_SITE_OTHER): Payer: BC Managed Care – PPO | Admitting: Family Medicine

## 2019-12-03 DIAGNOSIS — M48061 Spinal stenosis, lumbar region without neurogenic claudication: Secondary | ICD-10-CM | POA: Diagnosis not present

## 2019-12-03 MED ORDER — DICLOFENAC SODIUM 75 MG PO TBEC: 75.0000 mg | DELAYED_RELEASE_TABLET | Freq: Two times a day (BID) | ORAL | 1 refills | Status: DC

## 2019-12-03 NOTE — Patient Instructions (Signed)
Your pain and exam are consistent with spinal stenosis. Try to avoid extension of your back as much as possible. You were given an IM injection of depomedrol (steroid) today. Start voltaren twice a day with food for pain and inflammation. If you're not improving by Wednesday call me and I'll send in a steroid dose pack. If still not improving we would do an MRI of your lumbar spine. Let me know how you're doing in a week at the latest.

## 2019-12-04 ENCOUNTER — Encounter: Payer: Self-pay | Admitting: Family Medicine

## 2019-12-04 DIAGNOSIS — M48061 Spinal stenosis, lumbar region without neurogenic claudication: Secondary | ICD-10-CM

## 2019-12-04 MED ORDER — METHYLPREDNISOLONE ACETATE 80 MG/ML IJ SUSP
80.0000 mg | Freq: Once | INTRAMUSCULAR | Status: AC
  Administered 2019-12-04: 80 mg via INTRAMUSCULAR

## 2019-12-04 NOTE — Progress Notes (Signed)
PCP: Caffie Damme, MD  Subjective:   HPI: Patient is a 49 y.o. female here for back pain.  Patient reports on Tuesday-Wednesday she started to get some tingling/burning in left scapular area. Progressed to include pain down into left side of low back. On Saturday she walk/jogged 5 miles (ran 3, walked 2) though nothing particularly different about this run compared to prior ones - it was hilly. Developed worsening pain in low back and going down both legs with associated numbness and tingling. No bowel/bladder dysfunction. She's tried biofreeze, magnesium, norel AD, muscle relaxant, advil without much benefit. Pain level 8-9/10. She had taken antibiotics and steroids about 10+ days ago.  Past Medical History:  Diagnosis Date  . Hypercholesterolemia     Current Outpatient Medications on File Prior to Visit  Medication Sig Dispense Refill  . albuterol (PROVENTIL HFA;VENTOLIN HFA) 108 (90 Base) MCG/ACT inhaler Inhale into the lungs every 6 (six) hours as needed for wheezing or shortness of breath.    . beclomethasone (QVAR) 40 MCG/ACT inhaler Inhale 1 puff into the lungs 2 (two) times daily.    . cyclobenzaprine (FLEXERIL) 10 MG tablet Take 1 tablet (10 mg total) by mouth 3 (three) times daily as needed for muscle spasms. 60 tablet 1  . fexofenadine (ALLEGRA) 180 MG tablet 1 mg.    . fluticasone (FLONASE) 50 MCG/ACT nasal spray     . ibuprofen (ADVIL,MOTRIN) 200 MG tablet Take 200 mg by mouth every 6 (six) hours as needed.    Marland Kitchen OVER THE COUNTER MEDICATION Calm aid, lavendar as needed    . OVER THE COUNTER MEDICATION estevera    . predniSONE (DELTASONE) 5 MG tablet Take 6 pills for first day, 5 pills second day, 4 pills third day, 3 pills fourth day, 2 pills the fifth day, and 1 pill sixth day. 21 tablet 0  . propranolol (INDERAL) 10 MG tablet 10 mg 2 (two) times daily.    . vitamin B-12 (CYANOCOBALAMIN) 1000 MCG tablet Take 1,000 mcg by mouth daily.    . Vitamin D, Ergocalciferol,  (DRISDOL) 1.25 MG (50000 UT) CAPS capsule weekly     Current Facility-Administered Medications on File Prior to Visit  Medication Dose Route Frequency Provider Last Rate Last Admin  . gadopentetate dimeglumine (MAGNEVIST) injection 14 mL  14 mL Intravenous Once PRN Penumalli, Vikram R, MD      . gadopentetate dimeglumine (MAGNEVIST) injection 14 mL  14 mL Intravenous Once PRN Penumalli, Glenford Bayley, MD        Past Surgical History:  Procedure Laterality Date  . ROBOTIC ASSISTED LAP VAGINAL HYSTERECTOMY  11/2015   has ovaries    No Known Allergies  Social History   Socioeconomic History  . Marital status: Married    Spouse name: Annette Stable  . Number of children: 2  . Years of education: 63  . 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 Smoking status: Never Smoker  . Smokeless tobacco: Never Used  Substance and Sexual Activity  . Alcohol use: No  . Drug use: No  . Sexual activity: Not on file  Other Topics Concern  . Not on file  Social History Narrative   08/30/18 lives with husband    caffeine -diet coke once a week   Social Determinants of Health   Financial Resource Strain:   . Difficulty of Paying Living Expenses:   Food Insecurity:   . Worried About Radiation protection practitioner  of Food in the Last Year:   . [place] in the Last Year:   Transportation Needs:   . Lack of Transportation (Medical):   Marland Kitchen Lack of Transportation (Non-Medical):   Physical Activity:   . Days of Exercise per Week:   . Minutes of Exercise per Session:   Stress:   . Feeling of Stress :   Social Connections:   . Frequency of Communication with Friends and Family:   . Frequency of Social Gatherings with Friends and Family:   . Attends Religious Services:   . Active Member of Clubs or Organizations:   . Attends Archivist Meetings:   Marland Kitchen Marital Status:   Intimate Partner Violence:   . Fear of Current or Ex-Partner:   . Emotionally Abused:    Marland Kitchen Physically Abused:   . Sexually Abused:     Family History  Problem Relation Age of Onset  . Cancer Father   . Alzheimer's disease Father   . Multiple sclerosis Brother     BP 128/88   Ht 5' 5.5" (1.664 m)   Wt 167 lb (75.8 kg)   BMI 27.37 kg/m   Review of Systems: See HPI above.     Objective:  Physical Exam:  Gen: NAD, comfortable in exam room  Back: No gross deformity, scoliosis. TTP bilateral paraspinal lumbar region.  No midline or bony TTP. FROM with pain on extension and flexion, equal. Strength LEs 5/5 all muscle groups.   2+ MSRs in patellar and achilles tendons, equal bilaterally. Negative SLRs. Sensation intact to light touch bilaterally. Negative logroll bilateral hips Negative fabers and piriformis stretches.   Assessment & Plan:  1. Low back pain with radiation into both lower extremities - concerning for spinal stenosis.  No red flag symptoms or signs on exam.  Given 80mg  IM depomedrol, will start voltaren twice a day.  Flexeril as needed.  Heat or ice if needed.  Let us know how she's doing over the next week.  Consider oral steroid dose pack, MRI if not improving.

## 2019-12-12 ENCOUNTER — Other Ambulatory Visit: Payer: Self-pay | Admitting: *Deleted

## 2019-12-12 MED ORDER — PREDNISONE 10 MG (21) PO TBPK
ORAL_TABLET | ORAL | 0 refills | Status: DC
Start: 2019-12-12 — End: 2019-12-12

## 2019-12-12 MED ORDER — PREDNISONE 10 MG (21) PO TBPK: ORAL_TABLET | ORAL | 0 refills | Status: DC

## 2020-07-10 DIAGNOSIS — J45909 Unspecified asthma, uncomplicated: Secondary | ICD-10-CM | POA: Insufficient documentation

## 2020-08-12 ENCOUNTER — Ambulatory Visit (INDEPENDENT_AMBULATORY_CARE_PROVIDER_SITE_OTHER): Payer: BC Managed Care – PPO | Admitting: Family Medicine

## 2020-08-12 ENCOUNTER — Other Ambulatory Visit: Payer: Self-pay

## 2020-08-12 VITALS — BP 118/80 | Ht 65.5 in | Wt 158.0 lb

## 2020-08-12 DIAGNOSIS — M899 Disorder of bone, unspecified: Secondary | ICD-10-CM | POA: Diagnosis not present

## 2020-08-12 MED ORDER — METHYLPREDNISOLONE ACETATE 40 MG/ML IJ SUSP
40.0000 mg | Freq: Once | INTRAMUSCULAR | Status: AC
  Administered 2020-08-12: 40 mg via INTRAMUSCULAR

## 2020-08-12 NOTE — Patient Instructions (Addendum)
Good to see you Please try heat  Please try the exercises  Please try compression   Please send me a message in MyChart with any questions or updates.  Please see me back in 4 weeks.   --Dr. Jordan Likes   It appears that you have scapular dysfunction. The muscles around the shoulder blades seem to be not communicating as well as they should. This can lead to upper back pain and pain that imminents around to the front. This can add up over a ran and be the most severe at the end of the run. We'll focus on stabilization with strengthening exercises. Compression can help with this as well as heat.

## 2020-08-12 NOTE — Progress Notes (Unsigned)
  Shelley Armstrong - 50 y.o. female MRN [id number]  Date of birth: 02-09-1971  SUBJECTIVE:  Including CC & ROS.  No chief complaint on file.   Shelley Armstrong is a 50 y.o. female that is presenting with upper thoracic back pain.  Has been progressively getting worse over the past few weeks.  She felt it worse at the end of the run.  Previous MRI was demonstrating no impingement or structural changes.  Has not changed her distance of runs.   Review of Systems See HPI   HISTORY: Past Medical, Surgical, Social, and Family History Reviewed & Updated per EMR.   Pertinent Historical Findings include:  Past Medical History:  Diagnosis Date  . Hypercholesterolemia     Past Surgical History:  Procedure Laterality Date  . ROBOTIC ASSISTED LAP VAGINAL HYSTERECTOMY  11/2015   has ovaries    Family History  Problem Relation Age of Onset  . Cancer Father   . Alzheimer's disease Father   . Multiple sclerosis Brother     Social History   Socioeconomic History  . Marital status: Married    Spouse name: Annette Stable  . Number of children: 2  . Years of education: 13  . 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 Smoking status: Never Smoker  . Smokeless tobacco: Never Used  Substance and Sexual Activity  . Alcohol use: No  . Drug use: No  . Sexual activity: Not on file  Other Topics Concern  . Not on file  Social History Narrative   08/30/18 lives with husband    caffeine -diet coke once a week   Social Determinants of Corporate investment banker Strain: Not on file  Food Insecurity: Not on file  Transportation Needs: Not on file  Physical Activity: Not on file  Stress: Not on file  Social Connections: Not on file  Intimate Partner Violence: Not on file     PHYSICAL EXAM:  VS: BP 118/80 (BP Location: Left Arm, Patient Position: Sitting, Cuff Size: Large)   Ht 5' 5.5" (1.664 m)   Wt 158 lb (71.7 kg)   BMI 25.89 kg/m  Physical  Exam Gen: NAD, alert, cooperative with exam, well-appearing MSK:  Right and left shoulder: Normal range of motion. Normal strength resistance. Normal flexion and extension. Neurovascularly intact     ASSESSMENT & PLAN:   Scapular dysfunction Pain is occurring at the end of her run.  The likely the result of scapular dysfunction.  May have a component of some hypermobility but not globally.  Previous MRI of the thoracic spine was not demonstrating any significant structural changes. -Counseled on home exercise therapy and supportive care. -IM Depo. -Could consider physical therapy.

## 2020-08-13 ENCOUNTER — Telehealth: Payer: Self-pay | Admitting: Family Medicine

## 2020-08-13 DIAGNOSIS — M899 Disorder of bone, unspecified: Secondary | ICD-10-CM | POA: Insufficient documentation

## 2020-08-13 NOTE — Telephone Encounter (Signed)
Spoke with patient about her concerns.   Myra Rude, MD [facility] 08/13/2020, 4:30 PM

## 2020-08-13 NOTE — Telephone Encounter (Signed)
Patient called states that after/during lunch today a wave of sharp pain went from ABD to Mid/Lower back and has been dull aching since then.  Pt request provider/nurse to call her back wonders if  Possible injection reaction?   Call [phone]  --glh

## 2020-08-13 NOTE — Assessment & Plan Note (Signed)
Pain is occurring at the end of her run.  The likely the result of scapular dysfunction.  May have a component of some hypermobility but not globally.  Previous MRI of the thoracic spine was not demonstrating any significant structural changes. -Counseled on home exercise therapy and supportive care. -IM Depo. -Could consider physical therapy.

## 2020-08-14 ENCOUNTER — Encounter: Payer: Self-pay | Admitting: Family Medicine

## 2020-08-15 ENCOUNTER — Other Ambulatory Visit: Payer: Self-pay | Admitting: Family Medicine

## 2020-08-15 ENCOUNTER — Ambulatory Visit (INDEPENDENT_AMBULATORY_CARE_PROVIDER_SITE_OTHER): Payer: BC Managed Care – PPO

## 2020-08-15 ENCOUNTER — Other Ambulatory Visit: Payer: Self-pay

## 2020-08-15 DIAGNOSIS — M899 Disorder of bone, unspecified: Secondary | ICD-10-CM

## 2020-08-15 DIAGNOSIS — M546 Pain in thoracic spine: Secondary | ICD-10-CM | POA: Diagnosis not present

## 2020-08-15 MED ORDER — CYCLOBENZAPRINE HCL 10 MG PO TABS: 10.0000 mg | ORAL_TABLET | Freq: Three times a day (TID) | ORAL | 1 refills | Status: DC | PRN

## 2020-08-20 ENCOUNTER — Telehealth: Payer: Self-pay | Admitting: Family Medicine

## 2020-08-20 NOTE — Telephone Encounter (Signed)
Informed of results.   Myra Rude, MD [facility] 08/20/2020, 9:36 AM

## 2020-09-09 ENCOUNTER — Ambulatory Visit: Payer: BC Managed Care – PPO | Admitting: Family Medicine

## 2021-05-13 ENCOUNTER — Ambulatory Visit (INDEPENDENT_AMBULATORY_CARE_PROVIDER_SITE_OTHER): Payer: BC Managed Care – PPO | Admitting: Family Medicine

## 2021-05-13 ENCOUNTER — Ambulatory Visit: Payer: Self-pay

## 2021-05-13 ENCOUNTER — Encounter: Payer: Self-pay | Admitting: Family Medicine

## 2021-05-13 VITALS — BP 120/82 | Ht 65.5 in | Wt 164.0 lb

## 2021-05-13 DIAGNOSIS — M222X1 Patellofemoral disorders, right knee: Secondary | ICD-10-CM | POA: Diagnosis not present

## 2021-05-13 DIAGNOSIS — M222X2 Patellofemoral disorders, left knee: Secondary | ICD-10-CM | POA: Diagnosis not present

## 2021-05-13 MED ORDER — DICLOFENAC SODIUM 2 % EX SOLN: 1.0000 "application " | Freq: Two times a day (BID) | CUTANEOUS | 2 refills | Status: DC

## 2021-05-13 NOTE — Progress Notes (Signed)
  Shelley Armstrong - 50 y.o. female MRN [id number]  Date of birth: Aug 24, 1970  SUBJECTIVE:  Including CC & ROS.  No chief complaint on file.   Shelley Armstrong is a 50 y.o. female that is presenting with bilateral knee pain.  The pain has been going for about 2 weeks.  Denies any injury or inciting event.  Occurs with walking and giving way.  No history of similar pain.   Review of Systems See HPI   HISTORY: Past Medical, Surgical, Social, and Family History Reviewed & Updated per EMR.   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  Tobacco Use   Smoking status: Never   Smokeless tobacco: Never  Substance and Sexual Activity   Alcohol use: No   Drug use: No   Sexual activity: Not on file  Other Topics Concern   Not on file  Social History Narrative   08/30/18 lives with husband    caffeine -diet coke once a week   Social Determinants of Corporate investment banker Strain: Not on file  Food Insecurity: Not on file  Transportation Needs: Not on file  Physical Activity: Not on file  Stress: Not on file  Social Connections: Not on file  Intimate Partner Violence: Not on file     PHYSICAL EXAM:  VS: BP 120/82 (BP Location: Left Arm, Patient Position: Sitting)   Ht 5' 5.5" (1.664 m)   Wt 164 lb (74.4 kg)   BMI 26.88 kg/m  Physical Exam Gen: NAD, alert, cooperative with exam, well-appearing   Limited ultrasound: Right and left knee:  No effusion in  suprapatellar pouch. Normal-appearing quadricep and patellar tendon. Normal-appearing medial joint space and lateral joint space  Summary: No structural changes appreciated  Ultrasound and interpretation by Clare Gandy, MD     ASSESSMENT & PLAN:   Patellofemoral pain syndrome of both knees Acutely occurring.  Has weakness on hip abduction with a normal ultrasound.  No stenosis observed on MRI of the lumbar spine.  Likely patellofemoral in nature. -Counseled on home exercise therapy and supportive care. -Pennsaid. -Could consider injection or physical therapy.

## 2021-05-13 NOTE — Assessment & Plan Note (Signed)
Acutely occurring.  Has weakness on hip abduction with a normal ultrasound.  No stenosis observed on MRI of the lumbar spine.  Likely patellofemoral in nature. -Counseled on home exercise therapy and supportive care. -Pennsaid. -Could consider injection or physical therapy.

## 2021-05-13 NOTE — Patient Instructions (Addendum)
Good to see you Please try the exercises   Please use ice as needed  Please use the rub on as needed Please send me a message in MyChart with any questions or updates.  Please see me back in 4 weeks.   --Dr. Jordan Likes

## 2021-06-08 ENCOUNTER — Ambulatory Visit: Payer: BC Managed Care – PPO | Admitting: Family Medicine

## 2021-09-15 ENCOUNTER — Ambulatory Visit (INDEPENDENT_AMBULATORY_CARE_PROVIDER_SITE_OTHER): Payer: BC Managed Care – PPO | Admitting: Family Medicine

## 2021-09-15 ENCOUNTER — Ambulatory Visit: Payer: Self-pay

## 2021-09-15 ENCOUNTER — Encounter: Payer: Self-pay | Admitting: Family Medicine

## 2021-09-15 VITALS — BP 110/82 | Ht 65.5 in | Wt 164.0 lb

## 2021-09-15 DIAGNOSIS — M24159 Other articular cartilage disorders, unspecified hip: Secondary | ICD-10-CM

## 2021-09-15 MED ORDER — TRIAMCINOLONE ACETONIDE 40 MG/ML IJ SUSP
40.0000 mg | Freq: Once | INTRAMUSCULAR | Status: AC
  Administered 2021-09-15: 40 mg via INTRA_ARTICULAR

## 2021-09-15 NOTE — Assessment & Plan Note (Signed)
Acutely occurring.  Pain seems more consistent with the joint space as opposed to the greater trochanteric bursa. ?-Counseled on home exercise therapy and supportive care. ?-Intra-articular injection today. ?-Could consider physical therapy or further imaging. ?

## 2021-09-15 NOTE — Patient Instructions (Signed)
Good to see you ?Please use ice as needed  ?Please try the exercises  ?Please message me tomorrow to let me know if your pain is gone or not  ?Please send me a message in MyChart with any questions or updates.  ?Please see me back in 4 weeks.  ? ?--Dr. Jordan Likes ? ?

## 2021-09-15 NOTE — Progress Notes (Signed)
?  Shelley Armstrong - 51 y.o. female MRN [id number]  Date of birth: [birth year] ? ?SUBJECTIVE:  Including CC & ROS.  ?No chief complaint on file. ? ? ?Shelley Armstrong is a 51 y.o. female that is presenting with acute left hip pain.  She is feeling pain laterally as well as in the gluteus and in the adductor region.  No inciting event.  She does run on a regular basis.  Seems to be getting worse.  No improvement with modalities to date. ? ? ?Review of Systems ?See HPI  ? ?HISTORY: Past Medical, Surgical, Social, and Family History Reviewed & Updated per EMR.   ?Pertinent Historical Findings include: ? ?Past Medical History:  ?Diagnosis Date  ? Hypercholesterolemia   ? ? ?Past Surgical History:  ?Procedure Laterality Date  ? ROBOTIC ASSISTED LAP VAGINAL HYSTERECTOMY  11/2015  ? has ovaries  ? ? ? ?PHYSICAL EXAM:  ?VS: BP 110/82 (BP Location: Left Arm, Patient Position: Sitting)   Ht 5' 5.5" (1.664 m)   Wt 164 lb (74.4 kg)   BMI 26.88 kg/m?  ?Physical Exam ?Gen: NAD, alert, cooperative with exam, well-appearing ?MSK:  ?Neurovascularly intact   ? ? ?Aspiration/Injection Procedure Note ?[facility] ?Oct 21, 1970 ? ?Procedure: Injection ?Indications: Left hip pain ? ?Procedure Details ?Consent: Risks of procedure as well as the alternatives and risks of each were explained to the (patient/caregiver).  Consent for procedure obtained. ?Time Out: Verified patient identification, verified procedure, site/side was marked, verified correct patient position, special equipment/implants available, medications/allergies/relevent history reviewed, required imaging and test results available.  Performed.  The area was cleaned with iodine and alcohol swabs.   ? ?The left hip joint was injected using 5 cc of 1% lidocaine on a 22-gauge 3-1/2 inch needle.  The syringe was switched to mixture containing 1 cc's of 40 mg Kenalog and 4 cc's of 0.25% bupivacaine was injected.  Ultrasound was used. Images were obtained in long views showing the injection.    ? ? ?A sterile dressing was applied. ? ?Patient did tolerate procedure well. ? ? ? ? ?ASSESSMENT & PLAN:  ? ?Labral tear of hip, degenerative ?Acutely occurring.  Pain seems more consistent with the joint space as opposed to the greater trochanteric bursa. ?-Counseled on home exercise therapy and supportive care. ?-Intra-articular injection today. ?-Could consider physical therapy or further imaging. ? ? ? ? ?

## 2021-09-16 ENCOUNTER — Encounter: Payer: Self-pay | Admitting: Family Medicine

## 2021-09-23 ENCOUNTER — Other Ambulatory Visit: Payer: Self-pay

## 2021-09-23 ENCOUNTER — Ambulatory Visit: Payer: Self-pay

## 2021-09-23 ENCOUNTER — Ambulatory Visit (INDEPENDENT_AMBULATORY_CARE_PROVIDER_SITE_OTHER): Payer: BC Managed Care – PPO | Admitting: Family Medicine

## 2021-09-23 ENCOUNTER — Encounter: Payer: Self-pay | Admitting: Family Medicine

## 2021-09-23 ENCOUNTER — Ambulatory Visit (HOSPITAL_BASED_OUTPATIENT_CLINIC_OR_DEPARTMENT_OTHER)
Admission: RE | Admit: 2021-09-23 | Discharge: 2021-09-23 | Disposition: A | Discharge status: Home / Self Care | Payer: BC Managed Care – PPO | Source: Ambulatory Visit | Attending: Family Medicine | Admitting: Family Medicine

## 2021-09-23 VITALS — BP 110/78 | Ht 65.5 in | Wt 164.0 lb

## 2021-09-23 DIAGNOSIS — M25552 Pain in left hip: Secondary | ICD-10-CM | POA: Insufficient documentation

## 2021-09-23 MED ORDER — METHYLPREDNISOLONE ACETATE 40 MG/ML IJ SUSP
40.0000 mg | Freq: Once | INTRAMUSCULAR | Status: AC
  Administered 2021-09-23: 40 mg via INTRA_ARTICULAR

## 2021-09-23 NOTE — Progress Notes (Signed)
?  Shelley Armstrong - 51 y.o. female MRN [id number]  Date of birth: 12-28-1970 ? ?SUBJECTIVE:  Including CC & ROS.  ?No chief complaint on file. ? ? ?Shelley Armstrong is a 51 y.o. female that is presenting with acute left lateral hip pain.  The pain in the groin did improve with the previous injection.  She is still experiencing the lateral hip pain that does go down to the mid thigh.  No significant radicular pain. ? ? ?Review of Systems ?See HPI  ? ?HISTORY: Past Medical, Surgical, Social, and Family History Reviewed & Updated per EMR.   ?Pertinent Historical Findings include: ? ?Past Medical History:  ?Diagnosis Date  ? Hypercholesterolemia   ? ? ?Past Surgical History:  ?Procedure Laterality Date  ? ROBOTIC ASSISTED LAP VAGINAL HYSTERECTOMY  11/2015  ? has ovaries  ? ? ? ?PHYSICAL EXAM:  ?VS: BP 110/78 (BP Location: Left Arm, Patient Position: Sitting)   Ht 5' 5.5" (1.664 m)   Wt 164 lb (74.4 kg)   BMI 26.88 kg/m?  ?Physical Exam ?Gen: NAD, alert, cooperative with exam, well-appearing ?MSK:  ?Neurovascularly intact   ? ? ?Aspiration/Injection Procedure Note ?[facility] ?09/19/1970 ? ?Procedure: Injection ?Indications: Left hip pain ? ?Procedure Details ?Consent: Risks of procedure as well as the alternatives and risks of each were explained to the (patient/caregiver).  Consent for procedure obtained. ?Time Out: Verified patient identification, verified procedure, site/side was marked, verified correct patient position, special equipment/implants available, medications/allergies/relevent history reviewed, required imaging and test results available.  Performed.  The area was cleaned with iodine and alcohol swabs.   ? ?The left greater trochanteric bursa was injected using 1 cc's of 40 mg Depo-Medrol and 4 cc's of 0.25% bupivacaine with a 22 3 1/2" needle.  Ultrasound was used. Images were obtained in short views showing the injection.   ? ? ?A sterile dressing was applied. ? ?Patient did tolerate procedure  well. ? ? ? ? ?ASSESSMENT & PLAN:  ? 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  Having pain that she localizes laterally as well as down IT band. ?-Counseled on home exercise therapy and supportive care. ?-Injection today. ?-Could consider shockwave therapy. ? ? ? ? ?

## 2021-09-23 NOTE — Assessment & Plan Note (Signed)
Acutely occurring.  Having pain that she localizes laterally as well as down IT band. ?-Counseled on home exercise therapy and supportive care. ?-Injection today. ?-Could consider shockwave therapy. ?

## 2021-09-23 NOTE — Patient Instructions (Signed)
Good to see you  ?Please try ice as needed  ?Please try the exercises  ?We can try shockwave therapy on the thigh if the pain is still ongoing  ?I will call with the xray results.  ?Please send me a message in MyChart with any questions or updates.  ?Please see me back in 4-6 weeks.  ? ?--Dr. Jordan Likes ? ?

## 2021-09-24 ENCOUNTER — Encounter: Payer: Self-pay | Admitting: Family Medicine

## 2021-10-23 ENCOUNTER — Ambulatory Visit: Payer: BC Managed Care – PPO | Admitting: Family Medicine

## 2021-11-18 ENCOUNTER — Emergency Department (HOSPITAL_COMMUNITY): Payer: BC Managed Care – PPO

## 2021-11-18 ENCOUNTER — Encounter (HOSPITAL_COMMUNITY): Payer: Self-pay

## 2021-11-18 ENCOUNTER — Emergency Department (HOSPITAL_COMMUNITY)
Admission: EM | Admit: 2021-11-18 | Discharge: 2021-11-18 | Disposition: A | Discharge status: Home / Self Care | Payer: BC Managed Care – PPO | Attending: Student | Admitting: Student

## 2021-11-18 ENCOUNTER — Other Ambulatory Visit: Payer: Self-pay

## 2021-11-18 DIAGNOSIS — K529 Noninfective gastroenteritis and colitis, unspecified: Secondary | ICD-10-CM | POA: Diagnosis not present

## 2021-11-18 DIAGNOSIS — R109 Unspecified abdominal pain: Secondary | ICD-10-CM | POA: Diagnosis present

## 2021-11-18 LAB — CBC
HCT: 45.4 % (ref 36.0–46.0)
Hemoglobin: 15.5 g/dL — ABNORMAL HIGH (ref 12.0–15.0)
MCH: 30.2 pg (ref 26.0–34.0)
MCHC: 34.1 g/dL (ref 30.0–36.0)
MCV: 88.3 fL (ref 80.0–100.0)
Platelets: 211 10*3/uL (ref 150–400)
RBC: 5.14 MIL/uL — ABNORMAL HIGH (ref 3.87–5.11)
RDW: 12.1 % (ref 11.5–15.5)
WBC: 13.1 10*3/uL — ABNORMAL HIGH (ref 4.0–10.5)
nRBC: 0 % (ref 0.0–0.2)

## 2021-11-18 LAB — PROTIME-INR
INR: 0.9 (ref 0.8–1.2)
Prothrombin Time: 12 seconds (ref 11.4–15.2)

## 2021-11-18 LAB — COMPREHENSIVE METABOLIC PANEL
ALT: 18 U/L (ref 0–44)
AST: 26 U/L (ref 15–41)
Albumin: 4.9 g/dL (ref 3.5–5.0)
Alkaline Phosphatase: 45 U/L (ref 38–126)
Anion gap: 12 (ref 5–15)
BUN: 12 mg/dL (ref 6–20)
CO2: 22 mmol/L (ref 22–32)
Calcium: 10.3 mg/dL (ref 8.9–10.3)
Chloride: 109 mmol/L (ref 98–111)
Creatinine, Ser: 1.04 mg/dL — ABNORMAL HIGH (ref 0.44–1.00)
GFR, Estimated: >60 mL/min (ref >60)
Glucose, Bld: 139 mg/dL — ABNORMAL HIGH (ref 70–99)
Potassium: 3.6 mmol/L (ref 3.5–5.1)
Sodium: 143 mmol/L (ref 135–145)
Total Bilirubin: 0.9 mg/dL (ref 0.3–1.2)
Total Protein: 7.6 g/dL (ref 6.5–8.1)

## 2021-11-18 LAB — URINALYSIS, ROUTINE W REFLEX MICROSCOPIC
Bacteria, UA: NONE SEEN
Bilirubin Urine: NEGATIVE
Glucose, UA: NEGATIVE mg/dL
Hgb urine dipstick: NEGATIVE
Ketones, ur: 80 mg/dL — AB
Nitrite: NEGATIVE
Protein, ur: NEGATIVE mg/dL
Specific Gravity, Urine: 1.01 (ref 1.005–1.030)
pH: 7 (ref 5.0–8.0)

## 2021-11-18 LAB — LIPASE, BLOOD: Lipase: 66 U/L — ABNORMAL HIGH (ref 11–51)

## 2021-11-18 LAB — LACTIC ACID, PLASMA
Lactic Acid, Venous: 3 mmol/L (ref 0.5–1.9)
Lactic Acid, Venous: 1 mmol/L (ref 0.5–1.9)

## 2021-11-18 LAB — APTT: aPTT: 24 seconds (ref 24–36)

## 2021-11-18 LAB — TYPE AND SCREEN
ABO/RH(D): A POS
Antibody Screen: NEGATIVE

## 2021-11-18 MED ORDER — SODIUM CHLORIDE 0.9 % IV BOLUS
1000.0000 mL | Freq: Once | INTRAVENOUS | Status: AC
  Administered 2021-11-18: 1000 mL via INTRAVENOUS

## 2021-11-18 MED ORDER — ONDANSETRON HCL 4 MG/2ML IJ SOLN
4.0000 mg | Freq: Once | INTRAMUSCULAR | Status: AC
Start: 2021-11-18 — End: 2021-11-18
  Administered 2021-11-18: 4 mg via INTRAVENOUS
  Filled 2021-11-18: qty 2

## 2021-11-18 MED ORDER — IOHEXOL 300 MG/ML  SOLN
100.0000 mL | Freq: Once | INTRAMUSCULAR | Status: AC | PRN
  Administered 2021-11-18: 100 mL via INTRAVENOUS

## 2021-11-18 MED ORDER — HYDROMORPHONE HCL 1 MG/ML IJ SOLN
1.0000 mg | Freq: Once | INTRAMUSCULAR | Status: AC
  Administered 2021-11-18: 1 mg via INTRAVENOUS
  Filled 2021-11-18: qty 1

## 2021-11-18 MED ORDER — ONDANSETRON 4 MG PO TBDP
4.0000 mg | ORAL_TABLET | Freq: Once | ORAL | Status: AC
Start: 2021-11-18 — End: 2021-11-18
  Administered 2021-11-18: 4 mg via ORAL
  Filled 2021-11-18: qty 1

## 2021-11-18 MED ORDER — ONDANSETRON HCL 4 MG PO TABS: 4.0000 mg | ORAL_TABLET | Freq: Four times a day (QID) | ORAL | 0 refills | Status: DC

## 2021-11-18 MED ORDER — METOCLOPRAMIDE HCL 5 MG/ML IJ SOLN
10.0000 mg | Freq: Once | INTRAMUSCULAR | Status: AC
  Administered 2021-11-18: 10 mg via INTRAVENOUS
  Filled 2021-11-18: qty 2

## 2021-11-18 NOTE — Discharge Instructions (Addendum)
Return with worsening symptoms but be sure to follow-up with your physician tomorrow.

## 2021-11-18 NOTE — ED Notes (Signed)
Pt to restroom, pt vomiting, PA notified, orders received.  Pt states she wishes to go home.  Pt to discharge via wheelchair.

## 2021-11-18 NOTE — ED Triage Notes (Signed)
Pt reports sudden onset of severe lower abdominal pain 2 hours ago that radiates around to back and up to chest. Pt also reports hematemesis.

## 2021-11-18 NOTE — ED Provider Notes (Signed)
[facility]-EMERGENCY DEPT Provider Note   CSN: [id number] Arrival date & time: 11/18/21  1639     History  Chief Complaint  Patient presents with   Abdominal Pain    Shelley Armstrong is a 51 y.o. female with a past medical history of hyperlipidemia and anxiety presenting today with acute onset abdominal pain, nausea and vomiting.  Says that it started 2 hours ago.  She tried to take a Zofran but was unable to keep it down.  No changes in her diet.  Says abdominal pain is generalized.  Says that she has not had a bowel movement since Saturday or Sunday and has had no flatulence.  Of note, she started [facility] 2 months ago for weight loss.  On Saturday her dose was increased.  On Sunday she started to vomit and contacted her physician who told her that they accidentally increased her dose too fast.  Original injection was 0.5mg  and on Saturday it was increased to 1.7 mg.   Abdominal Pain Associated symptoms: chills, constipation, nausea and vomiting   Associated symptoms: no dysuria, no fever, no hematuria and no vaginal discharge       Home Medications Prior to Admission medications   Medication Sig Start Date End Date Taking? Authorizing Provider  albuterol (PROVENTIL HFA;VENTOLIN HFA) 108 (90 Base) MCG/ACT inhaler Inhale into the lungs every 6 (six) hours as needed for wheezing or shortness of breath.    [provider]  beclomethasone (QVAR) 40 MCG/ACT inhaler Inhale 1 puff into the lungs 2 (two) times daily.    [provider]  cyclobenzaprine (FLEXERIL) 10 MG tablet Take 1 tablet (10 mg total) by mouth 3 (three) times daily as needed for muscle spasms. 08/15/20   Myra Rude, MD  diclofenac (VOLTAREN) 75 MG EC tablet Take 1 tablet (75 mg total) by mouth 2 (two) times daily. 12/03/19   Hudnall, Azucena Fallen, MD  Diclofenac Sodium (PENNSAID) 2 % SOLN Place 1 application onto the skin 2 (two) times daily. 05/13/21   Myra Rude, MD  fexofenadine  (ALLEGRA) 180 MG tablet 1 mg. 07/19/11   [provider]  fluticasone Aleda Grana) 50 MCG/ACT nasal spray  03/17/16   [provider]  ibuprofen (ADVIL,MOTRIN) 200 MG tablet Take 200 mg by mouth every 6 (six) hours as needed.    [provider]  OVER THE COUNTER MEDICATION Calm aid, lavendar as needed    [provider]  OVER THE COUNTER MEDICATION estevera    [provider]  predniSONE (STERAPRED UNI-PAK 21 TAB) 10 MG (21) TBPK tablet sterapred 6-day dose pak Take as directed 12/12/19   Hudnall, Azucena Fallen, MD  propranolol (INDERAL) 10 MG tablet 10 mg 2 (two) times daily. 08/24/18   [provider]  vitamin B-12 (CYANOCOBALAMIN) 1000 MCG tablet Take 1,000 mcg by mouth daily.    [provider]  Vitamin D, Ergocalciferol, (DRISDOL) 1.25 MG (50000 UT) CAPS capsule weekly 08/28/18   [provider]      Allergies    Patient has no known allergies.    Review of Systems   Review of Systems  Constitutional:  Positive for chills. Negative for diaphoresis and fever.  Gastrointestinal:  Positive for abdominal pain, constipation, nausea and vomiting.  Genitourinary:  Negative for dysuria, hematuria and vaginal discharge.  Musculoskeletal:  Positive for myalgias.  Neurological:  Negative for dizziness.   Physical Exam Updated Vital Signs BP (!) 131/97 (BP Location: Left Arm)   Pulse Marland Kitchen)  109   Temp 98.3 F (36.8 C) (Oral)   Resp (!) 33   SpO2 100%  Physical Exam Vitals and nursing note reviewed.  Constitutional:      General: She is not in acute distress.    Appearance: Normal appearance. She is not ill-appearing.  HENT:     Head: Normocephalic and atraumatic.  Eyes:     General: No scleral icterus.    Conjunctiva/sclera: Conjunctivae normal.  Cardiovascular:     Rate and Rhythm: Normal rate and regular rhythm.  Pulmonary:     Effort: Pulmonary effort is normal. No respiratory distress.     Breath sounds: No wheezing.   Abdominal:     Tenderness: There is generalized abdominal tenderness.     Comments: Abdomen soft, patient guarding and rolling on stretcher.  Difficult to perform abdominal exam due to discomfort.  Obvious and present peritoneal signs  Skin:    Findings: No rash.  Neurological:     Mental Status: She is alert.  Psychiatric:        Mood and Affect: Mood normal.    ED Results / Procedures / Treatments   Labs (all labs ordered are listed, but only abnormal results are displayed) Labs Reviewed  LIPASE, BLOOD - Abnormal; Notable for the following components:      Result Value   Lipase 66 (*)    All other components within normal limits  COMPREHENSIVE METABOLIC PANEL - Abnormal; Notable for the following components:   Glucose, Bld 139 (*)    Creatinine, Ser 1.04 (*)    All other components within normal limits  CBC - Abnormal; Notable for the following components:   WBC 13.1 (*)    RBC 5.14 (*)    Hemoglobin 15.5 (*)    All other components within normal limits  LACTIC ACID, PLASMA - Abnormal; Notable for the following components:   Lactic Acid, Venous 3.0 (*)    All other components within normal limits  CULTURE, BLOOD (ROUTINE X 2)  CULTURE, BLOOD (ROUTINE X 2)  LACTIC ACID, PLASMA  PROTIME-INR  APTT  URINALYSIS, ROUTINE W REFLEX MICROSCOPIC  CBG MONITORING, ED  TYPE AND SCREEN    EKG None  Radiology DG Abdomen 1 View  Result Date: 11/18/2021 CLINICAL DATA:  Lower abdominal pain EXAM: ABDOMEN - 1 VIEW COMPARISON:  None Available. FINDINGS: The bowel gas pattern is normal. No radio-opaque calculi or other significant radiographic abnormality are seen. Moderate stool in the colon IMPRESSION: Negative. Electronically Signed   By: Jasmine PangKim  Fujinaga M.D.   On: 11/18/2021 17:42   CT ABDOMEN PELVIS W CONTRAST  Result Date: 11/18/2021 CLINICAL DATA:  Abdominal pain EXAM: CT ABDOMEN AND PELVIS WITH CONTRAST TECHNIQUE: Multidetector CT imaging of the abdomen and pelvis was  performed using the standard protocol following bolus administration of intravenous contrast. RADIATION DOSE REDUCTION: This exam was performed according to the departmental dose-optimization program which includes automated exposure control, adjustment of the mA and/or kV according to patient size and/or use of iterative reconstruction technique. CONTRAST:  100mL OMNIPAQUE IOHEXOL 300 MG/ML  SOLN COMPARISON:  CT abdomen and pelvis dated December 28, 2015 FINDINGS: Lower chest: No acute abnormality. Hepatobiliary: No focal liver abnormality is seen. No gallstones, gallbladder wall thickening, or biliary dilatation. Pancreas: Unremarkable. No pancreatic ductal dilatation or surrounding inflammatory changes. Spleen: Normal in size without focal abnormality. Adrenals/Urinary Tract: The bilateral adrenal glands are unremarkable. Small low-attenuation lesions of the left kidney, unchanged when compared with prior exam and likely simple cysts.  Bladder is unremarkable. Stomach/Bowel: Stomach is within normal limits. Appendix appears normal. Mild wall thickening of the distal transverse colon. No evidence of obstruction or inflammatory change. Vascular/Lymphatic: No significant vascular findings are present. No enlarged abdominal or pelvic lymph nodes. Reproductive: Status post hysterectomy. No adnexal masses. Other: No abdominal wall hernia or abnormality. No abdominopelvic ascites. Musculoskeletal: No acute or significant osseous findings. IMPRESSION: Mild wall thickening of the distal transverse colon, findings can be seen in the setting of colitis. Electronically Signed   By: Allegra Lai M.D.   On: 11/18/2021 18:44    Procedures Procedures   Medications Ordered in ED Medications  sodium chloride 0.9 % bolus 1,000 mL (1,000 mLs Intravenous New Bag/Given 11/18/21 1725)  HYDROmorphone (DILAUDID) injection 1 mg (1 mg Intravenous Given 11/18/21 1723)  ondansetron (ZOFRAN) injection 4 mg (4 mg Intravenous Given 11/18/21  1723)  iohexol (OMNIPAQUE) 300 MG/ML solution 100 mL (100 mLs Intravenous Contrast Given 11/18/21 1825)  HYDROmorphone (DILAUDID) injection 1 mg (1 mg Intravenous Given 11/18/21 1922)  metoCLOPramide (REGLAN) injection 10 mg (10 mg Intravenous Given 11/18/21 1920)    ED Course/ Medical Decision Making/ A&P Clinical Course as of 11/18/21 2025  Wed Nov 18, 2021  1757 Reevaluated after Dilaudid and reports feeling much better.  Complains of dry mouth, n.p.o. status explained to patient and her husband [MR]  1807 Lipase(!): 66 [MR]    Clinical Course User Index [MR] Zaiden Ludlum, Gabriel Cirri, PA-C                           Medical Decision Making Amount and/or Complexity of Data Reviewed Labs: ordered. Decision-making details documented in ED Course. Radiology: ordered.  Risk Prescription drug management.   51 year old female who presented today with acute onset abdominal pain.  Of note, patient had her Wegovy injection increased too quickly on Saturday.  Specific differential for this includes but is not limited to acute pancreatitis, cholecystectomy, bowel obstruction and peritonitis.    Additional differential of abdominal pain includes but is not limited to AAA, gastroenteritis, appendicitis, Bowel perforation. Gastroparesis, DKA, Hernia, Inflammatory bowel disease, mesenteric ischemia, SBP, volvulus.   This is not an exhaustive differential.    Past Medical History / Co-morbidities / Social History: Hyperlipidemia and obesity   Additional history: I am unable to see medical charts from [facility] who prescribes her [facility].   Physical Exam: Physical exam performed. The pertinent findings include: Generalized tenderness and peritoneal signs on original exam  Lab Tests: I ordered, and personally interpreted labs.  The pertinent results include:  -WBC 13.1 -Lipase 66 -Lactic #1 3.0 -Lactic #2 normal   Imaging Studies: I ordered, viewed and interpreted imaging studies. -Plain  film abdomen, negative -CT abdomen pelvis, with colitis  I agree with radiology  Medications: I ordered medication including Dilaudid and Zofran. Reevaluation of the patient after these medicines showed that the patient improved.  Symptoms came back so another dilaudid and reglan ordered   Disposition: Patient was reevaluated after multiple doses of Dilaudid, Zofran and Reglan.  She says her pain is about 2-3 but denies any nausea.  No signs of pancreatitis or cholecystitis.  She is afebrile.  She was offered admission in the setting of her initially elevated lactic acid as well as her severe pain, nausea and vomiting.  She says that she feels comfortable going home.  I believe this is reasonable being that her second lactic is normal and she has an appointment tomorrow  afternoon.  I discussed this case with my attending physician Dr. Posey Rea who cosigned this note including patient's presenting symptoms, physical exam, and planned diagnostics and interventions. Attending physician stated agreement with plan or made changes to plan which were implemented.      Final Clinical Impression(s) / ED Diagnoses Final diagnoses:  Colitis    Rx / DC Orders   Results and diagnoses were explained to the patient. Return precautions discussed in full. Patient had no additional questions and expressed complete understanding.   This chart was dictated using voice recognition software.  Despite best efforts to proofread,  errors can occur which can change the documentation meaning.    Saddie Benders, PA-C 11/18/21 2027    Sloan Leiter, DO 11/19/21 1130

## 2021-11-23 LAB — CULTURE, BLOOD (ROUTINE X 2)
Culture: NO GROWTH
Culture: NO GROWTH
Special Requests: ADEQUATE

## 2022-04-12 ENCOUNTER — Other Ambulatory Visit: Payer: Self-pay | Admitting: *Deleted

## 2022-04-12 MED ORDER — CYCLOBENZAPRINE HCL 10 MG PO TABS: 10.0000 mg | ORAL_TABLET | Freq: Three times a day (TID) | ORAL | 1 refills | Status: DC | PRN

## 2022-06-03 ENCOUNTER — Telehealth: Payer: Self-pay | Admitting: Physical Medicine and Rehabilitation

## 2022-06-03 NOTE — Telephone Encounter (Signed)
Patient would like too schedule with Dr Alvester Morin [phone]

## 2022-06-04 NOTE — Telephone Encounter (Signed)
Spoke with patient and she is having back pain in the middle of her back. She was recommended to come here from her husband. She was seeing Dr. Jordan Likes at [facility] in [facility] previously with him doing an injection and PT. Scheduled OV for 06/08/22

## 2022-06-08 ENCOUNTER — Ambulatory Visit: Payer: BC Managed Care – PPO | Admitting: Physical Medicine and Rehabilitation

## 2022-06-08 DIAGNOSIS — G8929 Other chronic pain: Secondary | ICD-10-CM | POA: Diagnosis not present

## 2022-06-08 DIAGNOSIS — M546 Pain in thoracic spine: Secondary | ICD-10-CM | POA: Diagnosis not present

## 2022-06-08 DIAGNOSIS — M7918 Myalgia, other site: Secondary | ICD-10-CM

## 2022-06-08 DIAGNOSIS — M549 Dorsalgia, unspecified: Secondary | ICD-10-CM | POA: Diagnosis not present

## 2022-06-08 MED ORDER — MELOXICAM 15 MG PO TABS: 15.0000 mg | ORAL_TABLET | Freq: Every day | ORAL | 0 refills | Status: DC

## 2022-06-08 MED ORDER — PREDNISONE 50 MG PO TABS: 50.0000 mg | ORAL_TABLET | Freq: Every day | ORAL | 0 refills | Status: DC

## 2022-06-08 NOTE — Progress Notes (Signed)
Shelley Armstrong - 52 y.o. female MRN [id number]  Date of birth: [birth year]  Office Visit Note: Visit Date: 06/08/2022 PCP: Caffie Damme, MD Referred by: Caffie Damme, MD  Subjective: Chief Complaint  Patient presents with   Middle Back - Pain   HPI: Shelley Armstrong is a 51 y.o. female who comes in today as a self referral for evaluation of chronic, worsening and severe bilateral thoracic back pain radiating to upper back, ongoing for several years, pain worsened 2 weeks ago. We currently treat her husband Shelley Armstrong. Her pain is exacerbated by movement and activity, she describes as sore, aching and tight, currently rates as 7 out of 10. Some relief of pain with home exercise regimen, rest and use of medications. States she is working with Systems analyst several times a week. Some relief with Tylenol and Flexeril. She has attended formal physical therapy in the past for more lower back issues, no history of dry needling. Thoracic MRI imaging from 2020 exhibits shallow central protrusion at T5-T6 without central canal or foraminal narrowing, there is also shallow broad based right paracentral disc protrusion at T7-T8 without canal or foraminal stenosis. No high grade spinal canal stenosis noted. No history of thoracic surgery/injections. Patient was previously seen by Dr. Clare Gandy for same issue and diagnosed with scapular dysfunction. Patient denies focal weakness, numbness and tingling. Patient denies recent trauma or falls.     Review of Systems  Musculoskeletal:  Positive for back pain and myalgias.  Neurological:  Negative for tingling, sensory change, focal weakness and weakness.  All other systems reviewed and are negative.  Otherwise per HPI.  Assessment & Plan: Visit Diagnoses:    ICD-10-CM   1. Chronic bilateral thoracic back pain  M54.6 Ambulatory referral to Physical Therapy   G89.29     2. Upper back pain  M54.9 Ambulatory referral to Physical Therapy    3. Myofascial pain  syndrome  M79.18 Ambulatory referral to Physical Therapy       Plan: Findings:  Chronic, worsening and severe bilateral thoracic back pain radiating to upper back. Patient continues to have severe pain despite good conservative therapies such as home exercise regimen, rest and use of medications. Patients clinical presentation and exam are consistent with myofascial pain syndrome. She is particularly tender to touch, multiple palpable trigger points noted to bilateral thoracic paraspinal regions and trapezius/rhomboid regions. I also feel there could be a type of central sensitization syndrome such as fibromyalgia contributing to her pain. Next step is to have patient re-group with our in house physical therapy team, I feel she would benefit from manual treatments and dry needling. I also discussed medication management with patient today, I did prescribe a 3 day course of Prednisone, upon completion of steroid medication I would like to start Meloxicam once daily for a month. I would like to see patient back in the office in 6 weeks for re-evaluation. If her pain persists we did discuss possibility of obtaining new thoracic MRI imaging. We would consider thoracic epidural steroid injection if appropriate. Patient encouraged to continue with conservative therapies and activity as tolerated. No red flag symptoms noted upon exam today.     Meds & Orders:  Meds ordered this encounter  Medications   predniSONE (DELTASONE) 50 MG tablet    Sig: Take 1 tablet (50 mg total) by mouth daily with breakfast. Take until completed.    Dispense:  5 tablet    Refill:  0   meloxicam (MOBIC) 15 MG  tablet    Sig: Take 1 tablet (15 mg total) by mouth daily.    Dispense:  30 tablet    Refill:  0    Orders Placed This Encounter  Procedures   Ambulatory referral to Physical Therapy    Follow-up: Return for 6 week follow up for re-evaluation.   Procedures: No procedures performed      Clinical History: T7-8:  The patient has a very shallow broad-based right paracentral protrusion which is new since the prior exam. No stenosis.   Except as noted above, intervertebral discs are unremarkable. The central canal and foramina are widely patent throughout.   IMPRESSION: No finding to explain the patient's symptoms.   No change in a shallow central protrusion at T5-6 without central canal or foraminal narrowing.   Very shallow broad-based right paracentral protrusion at T7-8 is new since the prior MRI but the central canal and foramina are widely patent.     Electronically Signed   By: Inge Rise M.D.   On: 08/25/2018 14:49   She reports that she has never smoked. She has never used smokeless tobacco. No results for input(s): "HGBA1C", "LABURIC" in the last 8760 hours.  Objective:  VS:  HT:    WT:   BMI:     BP:   HR: bpm  TEMP: ( )  RESP:  Physical Exam Vitals and nursing note reviewed.  HENT:     Head: Normocephalic and atraumatic.     Right Ear: External ear normal.     Left Ear: External ear normal.     Nose: Nose normal.     Mouth/Throat:     Mouth: Mucous membranes are moist.  Eyes:     Extraocular Movements: Extraocular movements intact.  Cardiovascular:     Rate and Rhythm: Normal rate.     Pulses: Normal pulses.  Pulmonary:     Effort: Pulmonary effort is normal.  Abdominal:     General: Abdomen is flat. There is no distension.  Musculoskeletal:        General: Tenderness present.     Cervical back: Normal range of motion.     Comments: Pt rises from seated position to standing without difficulty. Good lumbar range of motion. Strong distal strength without clonus, no pain upon palpation of greater trochanters. Sensation intact bilaterally. Multiple palpable trigger points noted to bilateral thoracic paraspinal regions and bilateral trapezius and rhomboid muscles. Walks independently, gait steady.   Skin:    General: Skin is warm and dry.     Capillary Refill:  Capillary refill takes less than 2 seconds.  Neurological:     General: No focal deficit present.     Mental Status: She is alert and oriented to person, place, and time.  Psychiatric:        Mood and Affect: Mood normal.        Behavior: Behavior normal.     Ortho Exam  Imaging: No results found.  Past Medical/Family/Surgical/Social History: Medications & Allergies reviewed per EMR, new medications updated. Patient Active Problem List   Diagnosis Date Noted   Greater trochanteric pain syndrome of left lower extremity 09/23/2021   Labral tear of hip, degenerative 09/15/2021   Patellofemoral pain syndrome of both knees 05/13/2021   Scapular dysfunction 08/13/2020   Right foot pain 06/11/2019   Back pain 04/21/2017   Low back pain radiating to right leg 02/15/2017   Hereditary and idiopathic peripheral neuropathy 04/19/2016   Long term use of drug 08/13/2014  Major depression, recurrent (HCC) 10/24/2013   Generalized anxiety disorder 02/27/2013   Hyperlipidemia 12/02/2012   Past Medical History:  Diagnosis Date   Hypercholesterolemia    Family History  Problem Relation Age of Onset   Cancer Father    Alzheimer's disease Father    Multiple sclerosis Brother    Past Surgical History:  Procedure Laterality Date   ROBOTIC ASSISTED LAP VAGINAL HYSTERECTOMY  11/2015   has ovaries   Social History   Occupational History    Comment: home maker  Tobacco Use   Smoking status: Never   Smokeless tobacco: Never  Substance and Sexual Activity   Alcohol use: No   Drug use: No   Sexual activity: Not on file

## 2022-06-08 NOTE — Progress Notes (Unsigned)
Numeric Pain Rating Scale and Functional Assessment Average Pain 7   In the last MONTH (on 0-10 scale) has pain interfered with the following?  1. General activity like being  able to carry out your everyday physical activities such as walking, climbing stairs, carrying groceries, or moving a chair?  Rating(9)   Pain in the middle of the back all the way across that started a couple weeks ago. Twisting and lying down makes pain worse

## 2022-06-09 ENCOUNTER — Encounter: Payer: Self-pay | Admitting: Physical Medicine and Rehabilitation

## 2022-06-24 NOTE — Therapy (Signed)
OUTPATIENT PHYSICAL THERAPY THORACOLUMBAR EVALUATION   Patient Name: Shelley Armstrong MRN: [id number] DOB:[birth year], 51 y.o., female Today's Date: 06/25/2022  END OF SESSION:  PT End of Session - 06/25/22 1058     Visit Number 1    Number of Visits 13    Date for PT Re-Evaluation 08/20/22    Authorization Type BCBS    PT Start Time 1100    PT Stop Time 1142    PT Time Calculation (min) 42 min    Activity Tolerance Patient tolerated treatment well;No increased pain    Behavior During Therapy [facility] for tasks assessed/performed             Past Medical History:  Diagnosis Date   Hypercholesterolemia    Past Surgical History:  Procedure Laterality Date   ROBOTIC ASSISTED LAP VAGINAL HYSTERECTOMY  11/2015   has ovaries   Patient Active Problem List   Diagnosis Date Noted   Greater trochanteric pain syndrome of left lower extremity 09/23/2021   Labral tear of hip, degenerative 09/15/2021   Patellofemoral pain syndrome of both knees 05/13/2021   Scapular dysfunction 08/13/2020   Right foot pain 06/11/2019   Back pain 04/21/2017   Low back pain radiating to right leg 02/15/2017   Hereditary and idiopathic peripheral neuropathy 04/19/2016   Long term use of drug 08/13/2014   Major depression, recurrent ([place]) 10/24/2013   Generalized anxiety disorder 02/27/2013   Hyperlipidemia 12/02/2012    PCP: Glendon Axe, MD  REFERRING PROVIDER: Lorine Bears, NP  REFERRING DIAG: M79.18 (ICD-10-CM) - Myofascial pain syndrome M54.9 (ICD-10-CM) - Upper back pain M54.6,G89.29 (ICD-10-CM) - Chronic bilateral thoracic back pain  Rationale for Evaluation and Treatment: Rehabilitation  THERAPY DIAG:  Pain in thoracic spine  Abnormal posture  Muscle weakness (generalized)  ONSET DATE: a couple months ago, sudden onset   SUBJECTIVE:                                                                                                                                                                                            SUBJECTIVE STATEMENT: Pt denies any known mechanism of injury, fairly sudden onset though a couple of months ago. Typically very active, runs often (2-3 times a week) and goes to weekly exercise classes. Has to lift heavy boxes for work at [employer] but has been modifying recently (unloading books prior to lifting). Has had previous hip issues that responded well to strength training.  Denies N/T, no headaches, no visual changes, no fevers or night sweats, no bowel/bladder changes or sensory changes.   PERTINENT HISTORY:  anxiety/depression, multiple MSK issues in past   PAIN:  Are you having pain: yes, 6/10 Location: middle back B slightly below shoulder blades How would you describe your pain? Aching pain typically, occasional sharp pain Best in past week: 3/10 Worst in past week: 8/10 (calms within 20 min with heating pad) Aggravating factors: sitting ~1hr, longer drives, running [id number], cold, occasional difficulty with standing, reaching overhead and household activities, stooped positioning  Easing factors: heating pad, medication, repositioning   PRECAUTIONS: None  WEIGHT BEARING RESTRICTIONS: No  FALLS:  Has patient fallen in last 6 months? No  LIVING ENVIRONMENT: With spouse and cat, 1 story, no STE   OCCUPATION: works at [employer], has to lift heavy boxes of books  PLOF: Independent  PATIENT GOALS: reduce pain  OBJECTIVE:   DIAGNOSTIC FINDINGS:  No recent imaging   PATIENT SURVEYS:  FOTO 74% predicted 80%  SCREENING FOR RED FLAGS: Unremarkable for red flags  COGNITION: Overall cognitive status: Within functional limits for tasks assessed     SENSATION: Light touch intact all extremities    POSTURE: rounded shoulders, forward head, and increased thoracic kyphosis  PALPATION: TTP B middle/lower traps, rhomboids, lats. R infraspinatus/teres. Trigger points throughout Mild stiffness with grade 2-3 PA mobs  throughout lower T spine, no overt pain, pt reports mild relief  CERVICAL ROM:   Active  A/PROM  eval  Flexion   Extension   Right lateral flexion   Left lateral flexion   Right rotation   Left rotation    (Blank rows = not tested)  Cervical ROM grossly WNL all planes, painless   LUMBAR ROM:   AROM eval  Flexion 100% painless (distal shin)  Extension 100%  Right lateral flexion   Left lateral flexion   Right rotation 100% *  Left rotation 100% *    (Blank rows = not tested) Comments: increased stiffness and reduction in rotation B w/ hips fixed compared to free hips, no change in pain  UPPER EXTREMITY ROM:  A/PROM Right eval Left eval  Shoulder flexion    Shoulder abduction    Shoulder internal rotation    Shoulder external rotation    Elbow flexion    Elbow extension    Wrist flexion    Wrist extension     (Blank rows = not tested) (Key: WFL = within functional limits not formally assessed, * = concordant pain, s = stiffness/stretching sensation, NT = not tested)  Comments:  GH ROM grossly WNL flexion and abduction, painless   UPPER EXTREMITY MMT:  MMT Right eval Left eval  Shoulder flexion 5 5  Shoulder extension    Shoulder abduction 5 5  Shoulder extension    Shoulder internal rotation 5 5  Shoulder external rotation 4* 4+  Elbow flexion    Elbow extension    Grip strength    (Blank rows = not tested)  (Key: WFL = within functional limits not formally assessed, * = concordant pain, s = stiffness/stretching sensation, NT = not tested)  Comments: of note, with HEP pt demonstrates notably reduced control of periscapular musculature with frequent compensations at thoracolumbar spine   GAIT: Distance walked: within clinic Assistive device utilized: None Level of assistance: Complete Independence Comments: WNL  TODAY'S TREATMENT:  [facility] Adult PT Treatment:                                                DATE: 06/25/22 Therapeutic Exercise: Open chain scapular depression ~10 reps but tendency towards heavy compensations, opted for closed chain modification for more proprioceptive input Closed chain scap depression x10 cues for form and reduced compensations, HEP (press ups) Scap retraction + double ER x10 GTB, x10 RTB cues for form and HEP Manual Therapy: Prone: STM and trigger point release throughout periscapular musculature B    PATIENT EDUCATION:  Education details: Pt education on PT impairments, prognosis, and POC. Informed consent. Rationale for interventions, safe/appropriate HEP performance Person educated: Patient Education method: Explanation, Demonstration, Tactile cues, Verbal cues, and Handouts Education comprehension: verbalized understanding, returned demonstration, verbal cues required, tactile cues required, and needs further education    HOME EXERCISE PROGRAM: Access Code: [email or web address] URL: [email or web address] Date: 06/25/2022 Prepared by: Fransisco Hertz  Exercises - Seated Shoulder Press Ups Off Table  - 1 x daily - 7 x weekly - 3 sets - 10 reps - Shoulder External Rotation and Scapular Retraction with Resistance  - 1 x daily - 7 x weekly - 3 sets - 10 reps  ASSESSMENT:  CLINICAL IMPRESSION: Patient is a 51 y.o. woman who was seen today for physical therapy evaluation and treatment for thoracic pain ongoing over past couple months, no known MOI. Pt describes herself as being quite active, running and going to exercise classes weekly. Pain tends to be provoked primarily by prolonged activities (running, sitting for periods of time) and heavier activities (lifting boxes of books at work). On examination pt demonstrates good cervicothoracic mobility overall but does have some concordant discomfort with thoracolumbar rotation, as well as some periscapular weakness. Concordant pain with  palpation of periscapular musculature, trigger points throughout. 6/10 pain on arrival, mild improvement to 5/10 reported after brief manual as above. Pt tolerates HEP well without increase in pain, does require cues for appropriate performance as she demonstrates reduced periscapular control with compensations at thoracolumbar spine. No adverse events, tolerates session well overall. Pt would likely benefit from skilled PT to address postural deficits and periscapular/GH weakness in order to maximize tolerance to typical activities. Pt departs today's session in no acute distress, all voiced questions/concerns addressed appropriately from PT perspective.     OBJECTIVE IMPAIRMENTS: decreased activity tolerance, decreased mobility, decreased strength, increased muscle spasms, impaired UE functional use, postural dysfunction, and pain.   ACTIVITY LIMITATIONS: carrying, lifting, bending, sitting, reach over head, and locomotion level  PARTICIPATION LIMITATIONS: community activity, occupation, and recreational exercise  PERSONAL FACTORS: Time since onset of injury/illness/exacerbation and 1 comorbidity: anxiety/depression  are also affecting patient's functional outcome.   REHAB POTENTIAL: Good  CLINICAL DECISION MAKING: Stable/uncomplicated  EVALUATION COMPLEXITY: Low   GOALS: Goals reviewed with patient? No  SHORT TERM GOALS: Target date: 07/16/2022 Pt will demonstrate appropriate understanding and performance of initially prescribed HEP in order to facilitate improved independence with management of symptoms.  Baseline: HEP provided on eval Goal status: INITIAL    LONG TERM GOALS: Target date: 08/06/2022 Pt will score 80% on FOTO in order to demonstrate improved perception of functional status due to symptoms.  Baseline: 74% Goal status: INITIAL  2.  Pt will demonstrate full thoracolumbar rotation AROM without pain in order to demonstrate improved tolerance to functional movement  patterns.   Baseline: see ROM chart above Goal status: INITIAL  3.  Pt will demonstrate grossly symmetrical in order to demonstrate improved strength for functional movements.  Baseline: see MMT chart above Goal status: INITIAL  4. Pt will report ability to sit for up to 1 hour with less than 2/10 pain on NPS for improved tolerance to daily activities  Baseline: up to 8/10 pain with sitting for an hour  Goal status: INITIAL   5. Pt will be able to run for up to 3 miles with less than 3/10 thoracic pain on NPS in order to return to PLOF and maximize health/QOL.    Baseline: up to 8/10 pain w/ running 3 miles   Goal status: INITIAL  PLAN:  PT FREQUENCY: 2x/week  PT DURATION: 6 weeks  PLANNED INTERVENTIONS: Therapeutic exercises, Therapeutic activity, Neuromuscular re-education, Balance training, Gait training, Patient/Family education, Self Care, Joint mobilization, Dry Needling, Electrical stimulation, Spinal mobilization, Cryotherapy, Moist heat, Taping, Manual therapy, and Re-evaluation. Above unless contraindicated.   PLAN FOR NEXT SESSION: Progress ROM/strengthening exercises as able/appropriate, review HEP. Pt expresses some interest in dry needling   Leeroy Cha PT, DPT 06/25/2022 3:41 PM

## 2022-06-25 ENCOUNTER — Ambulatory Visit: Payer: BC Managed Care – PPO | Admitting: Physical Therapy

## 2022-06-25 ENCOUNTER — Other Ambulatory Visit: Payer: Self-pay

## 2022-06-25 ENCOUNTER — Encounter: Payer: Self-pay | Admitting: Physical Therapy

## 2022-06-25 DIAGNOSIS — R293 Abnormal posture: Secondary | ICD-10-CM

## 2022-06-25 DIAGNOSIS — M6281 Muscle weakness (generalized): Secondary | ICD-10-CM | POA: Diagnosis not present

## 2022-06-25 DIAGNOSIS — M546 Pain in thoracic spine: Secondary | ICD-10-CM

## 2022-07-02 ENCOUNTER — Ambulatory Visit: Payer: BC Managed Care – PPO | Admitting: Rehabilitative and Restorative Service Providers"

## 2022-07-02 ENCOUNTER — Encounter: Payer: Self-pay | Admitting: Rehabilitative and Restorative Service Providers"

## 2022-07-02 DIAGNOSIS — R293 Abnormal posture: Secondary | ICD-10-CM | POA: Diagnosis not present

## 2022-07-02 DIAGNOSIS — M6281 Muscle weakness (generalized): Secondary | ICD-10-CM

## 2022-07-02 DIAGNOSIS — M546 Pain in thoracic spine: Secondary | ICD-10-CM | POA: Diagnosis not present

## 2022-07-02 NOTE — Therapy (Signed)
OUTPATIENT PHYSICAL THERAPY TREATMENT   Patient Name: Shelley Armstrong MRN: [id number] DOB:06-03-1971, 52 y.o., female Today's Date: 07/02/2022  END OF SESSION:  PT End of Session - 07/02/22 0926     Visit Number 2    Number of Visits 13    Date for PT Re-Evaluation 08/20/22    Authorization Type BCBS    PT Start Time 0930    PT Stop Time 1008    PT Time Calculation (min) 38 min    Activity Tolerance Patient tolerated treatment well    Behavior During Therapy [facility] for tasks assessed/performed              Past Medical History:  Diagnosis Date   Hypercholesterolemia    Past Surgical History:  Procedure Laterality Date   ROBOTIC ASSISTED LAP VAGINAL HYSTERECTOMY  11/2015   has ovaries   Patient Active Problem List   Diagnosis Date Noted   Greater trochanteric pain syndrome of left lower extremity 09/23/2021   Labral tear of hip, degenerative 09/15/2021   Patellofemoral pain syndrome of both knees 05/13/2021   Scapular dysfunction 08/13/2020   Right foot pain 06/11/2019   Back pain 04/21/2017   Low back pain radiating to right leg 02/15/2017   Hereditary and idiopathic peripheral neuropathy 04/19/2016   Long term use of drug 08/13/2014   Major depression, recurrent ([facility]) 10/24/2013   Generalized anxiety disorder 02/27/2013   Hyperlipidemia 12/02/2012    PCP: Caffie Damme, MD  REFERRING PROVIDER: Juanda Chance, NP  REFERRING DIAG: M79.18 (ICD-10-CM) - Myofascial pain syndrome M54.9 (ICD-10-CM) - Upper back pain M54.6,G89.29 (ICD-10-CM) - Chronic bilateral thoracic back pain  Rationale for Evaluation and Treatment: Rehabilitation  THERAPY DIAG:  Pain in thoracic spine  Abnormal posture  Muscle weakness (generalized)  ONSET DATE: a couple months ago, sudden onset   SUBJECTIVE:                                                                                                                                                                                            SUBJECTIVE STATEMENT: Pt indicated about the same since last visit.  "Real sore across back of shoulders/middle back"  Reported sore/achy feeling.   PERTINENT HISTORY:  anxiety/depression, multiple MSK issues in past   PAIN:  Are you having pain: 5/10 Location: middle back bilateral slightly below shoulder blades How would you describe your pain? Aching pain typically, occasional sharp pain Aggravating factors: cold, prolonged sitting Easing factors: heating pad, medication, repositioning   PRECAUTIONS: None  WEIGHT BEARING RESTRICTIONS: No  FALLS:  Has patient fallen in last 6 months? No  LIVING ENVIRONMENT: With spouse  and cat, 1 story, no STE  OCCUPATION: works at [employer], has to lift heavy boxes of books  PLOF: Independent  PATIENT GOALS: reduce pain  OBJECTIVE:   DIAGNOSTIC FINDINGS:  06/25/2022 No recent imaging   PATIENT SURVEYS:  06/25/2022 FOTO 74% predicted 80%  SCREENING FOR RED FLAGS: 06/25/2022 Unremarkable for red flags  COGNITION: 06/25/2022 Overall cognitive status: Within functional limits for tasks assessed     SENSATION: 06/25/2022 Light touch intact all extremities    POSTURE:  06/25/2022 rounded shoulders, forward head, and increased thoracic kyphosis  PALPATION: 06/25/2022 TTP B middle/lower traps, rhomboids, lats. R infraspinatus/teres. Trigger points throughout Mild stiffness with grade 2-3 PA mobs throughout lower T spine, no overt pain, pt reports mild relief  CERVICAL ROM:   Active  A/PROM  eval  Flexion   Extension   Right lateral flexion   Left lateral flexion   Right rotation   Left rotation    (Blank rows = not tested)  Cervical ROM grossly WNL all planes, painless   LUMBAR ROM:   AROM 06/25/2022  Flexion 100% painless (distal shin)  Extension 100%  Right lateral flexion   Left lateral flexion   Right rotation 100% *  Left rotation 100% *    (Blank rows = not tested) Comments: increased  stiffness and reduction in rotation B w/ hips fixed compared to free hips, no change in pain  UPPER EXTREMITY ROM:  A/PROM Right eval Left eval  Shoulder flexion    Shoulder abduction    Shoulder internal rotation    Shoulder external rotation    Elbow flexion    Elbow extension    Wrist flexion    Wrist extension     (Blank rows = not tested) (Key: WFL = within functional limits not formally assessed, * = concordant pain, s = stiffness/stretching sensation, NT = not tested)  Comments:  06/25/2022 GH ROM grossly WNL flexion and abduction, painless   UPPER EXTREMITY MMT:  MMT Right 06/25/2022 Left 06/25/2022  Shoulder flexion 5 5  Shoulder extension    Shoulder abduction 5 5  Shoulder extension    Shoulder internal rotation 5 5  Shoulder external rotation 4* 4+  Elbow flexion    Elbow extension    Grip strength    (Blank rows = not tested)  (Key: WFL = within functional limits not formally assessed, * = concordant pain, s = stiffness/stretching sensation, NT = not tested)  Comments: of note, with HEP pt demonstrates notably reduced control of periscapular musculature with frequent compensations at thoracolumbar spine   GAIT: 06/25/2022 Distance walked: within clinic Assistive device utilized: None Level of assistance: Complete Independence Comments: WNL  TODAY'S TREATMENT:                                                                                                           DATE: 07/02/2022 Therapeutic Exercise: Prone scapular retraction 5 sec hold x 10 Prone scapular retraction c GH ext 5 sec hold x 10 Seated green band bilateral shoulder ER c scapular retraction x  10 Standing warrior pose thoracic rotation at wall c horizontal shoulder abduction without band x 5 bilateral, with green band x 10 bilateral UBE fwd/back 3 mins each way lvl 2.5   Review of techniques of new HEP additions.   Manual Therapy: Prone cPA G3 T5-T9, G5 regional rotation manip mid  thoracic x 2, no cavitations noted, no adverse reactions reported/observed    TODAY'S TREATMENT:                                                                                                           DATE: 06/25/22 Therapeutic Exercise: Open chain scapular depression ~10 reps but tendency towards heavy compensations, opted for closed chain modification for more proprioceptive input Closed chain scap depression x10 cues for form and reduced compensations, HEP (press ups) Scap retraction + double ER x10 GTB, x10 RTB cues for form and HEP Manual Therapy: Prone: STM and trigger point release throughout periscapular musculature B    PATIENT EDUCATION:  Education details: Pt education on PT impairments, prognosis, and POC. Informed consent. Rationale for interventions, safe/appropriate HEP performance Person educated: Patient Education method: Explanation, Demonstration, Tactile cues, Verbal cues, and Handouts Education comprehension: verbalized understanding, returned demonstration, verbal cues required, tactile cues required, and needs further education    HOME EXERCISE PROGRAM: Access Code: [email or web address] URL: [email or web address] Date: 07/02/2022 Prepared by: Scot Jun  Exercises - Seated Shoulder Press Ups Off Table  - 1-2 x daily - 7 x weekly - 3 sets - 10 reps - Shoulder External Rotation and Scapular Retraction with Resistance  - 1-2 x daily - 7 x weekly - 2-3 sets - 10 reps - Prone Scapular Retraction  - 2 x daily - 7 x weekly - 1 sets - 10 reps - 5 hold - Prone Scapular Slide with Shoulder Extension  - 2 x daily - 7 x weekly - 1 sets - 10 reps - 5 hold  ASSESSMENT:  CLINICAL IMPRESSION: Mild restriction in thoracic passive accessory motion.  Weakness and control deficits related to scapular control and BUE at this time.  Progressed HEP to include further strengthening.  Tenderness in thoracic paraspinals that may benefit from dry needling in future visits if Pt  in agreement.   OBJECTIVE IMPAIRMENTS: decreased activity tolerance, decreased mobility, decreased strength, increased muscle spasms, impaired UE functional use, postural dysfunction, and pain.   ACTIVITY LIMITATIONS: carrying, lifting, bending, sitting, reach over head, and locomotion level  PARTICIPATION LIMITATIONS: community activity, occupation, and recreational exercise  PERSONAL FACTORS: Time since onset of injury/illness/exacerbation and 1 comorbidity: anxiety/depression  are also affecting patient's functional outcome.   REHAB POTENTIAL: Good  CLINICAL DECISION MAKING: Stable/uncomplicated  EVALUATION COMPLEXITY: Low   GOALS: Goals reviewed with patient? No  SHORT TERM GOALS: Target date: 07/16/2022 Pt will demonstrate appropriate understanding and performance of initially prescribed HEP in order to facilitate improved independence with management of symptoms.  Baseline: HEP provided on eval Goal status: on gong 07/02/2022    LONG TERM GOALS: Target date: 08/06/2022 Pt will score 80% on FOTO in order to demonstrate  improved perception of functional status due to symptoms.  Baseline: 74% Goal status: INITIAL  2.  Pt will demonstrate full thoracolumbar rotation AROM without pain in order to demonstrate improved tolerance to functional movement patterns.  Baseline: see ROM chart above Goal status: INITIAL  3.  Pt will demonstrate grossly symmetrical in order to demonstrate improved strength for functional movements.  Baseline: see MMT chart above Goal status: INITIAL  4. Pt will report ability to sit for up to 1 hour with less than 2/10 pain on NPS for improved tolerance to daily activities  Baseline: up to 8/10 pain with sitting for an hour  Goal status: INITIAL   5. Pt will be able to run for up to 3 miles with less than 3/10 thoracic pain on NPS in order to return to PLOF and maximize health/QOL.    Baseline: up to 8/10 pain w/ running 3 miles   Goal status:  INITIAL  PLAN:  PT FREQUENCY: 2x/week  PT DURATION: 6 weeks  PLANNED INTERVENTIONS: Therapeutic exercises, Therapeutic activity, Neuromuscular re-education, Balance training, Gait training, Patient/Family education, Self Care, Joint mobilization, Dry Needling, Electrical stimulation, Spinal mobilization, Cryotherapy, Moist heat, Taping, Manual therapy, and Re-evaluation. Above unless contraindicated.   PLAN FOR NEXT SESSION: Check new exercsies, possible dry needling pending symptoms.    Chyrel Masson, PT, DPT, OCS, ATC 07/02/22  10:16 AM

## 2022-07-06 ENCOUNTER — Encounter: Payer: Self-pay | Admitting: Physical Therapy

## 2022-07-06 ENCOUNTER — Ambulatory Visit: Payer: BC Managed Care – PPO | Admitting: Physical Therapy

## 2022-07-06 DIAGNOSIS — R293 Abnormal posture: Secondary | ICD-10-CM | POA: Diagnosis not present

## 2022-07-06 DIAGNOSIS — M546 Pain in thoracic spine: Secondary | ICD-10-CM | POA: Diagnosis not present

## 2022-07-06 DIAGNOSIS — M6281 Muscle weakness (generalized): Secondary | ICD-10-CM | POA: Diagnosis not present

## 2022-07-06 NOTE — Therapy (Signed)
OUTPATIENT PHYSICAL THERAPY TREATMENT   Patient Name: Shelley Armstrong MRN: [id number] DOB:31-Mar-1971, 52 y.o., female Today's Date: 07/06/2022  END OF SESSION:  PT End of Session - 07/06/22 1434     Visit Number 3    Number of Visits 13    Date for PT Re-Evaluation 08/20/22    Authorization Type BCBS    PT Start Time 1430    PT Stop Time 1510    PT Time Calculation (min) 40 min    Activity Tolerance Patient tolerated treatment well    Behavior During Therapy [facility] for tasks assessed/performed              Past Medical History:  Diagnosis Date   Hypercholesterolemia    Past Surgical History:  Procedure Laterality Date   ROBOTIC ASSISTED LAP VAGINAL HYSTERECTOMY  11/2015   has ovaries   Patient Active Problem List   Diagnosis Date Noted   Greater trochanteric pain syndrome of left lower extremity 09/23/2021   Labral tear of hip, degenerative 09/15/2021   Patellofemoral pain syndrome of both knees 05/13/2021   Scapular dysfunction 08/13/2020   Right foot pain 06/11/2019   Back pain 04/21/2017   Low back pain radiating to right leg 02/15/2017   Hereditary and idiopathic peripheral neuropathy 04/19/2016   Long term use of drug 08/13/2014   Major depression, recurrent (HCC) 10/24/2013   Generalized anxiety disorder 02/27/2013   Hyperlipidemia 12/02/2012    PCP: Caffie Damme, MD  REFERRING PROVIDER: Juanda Chance, NP  REFERRING DIAG: M79.18 (ICD-10-CM) - Myofascial pain syndrome M54.9 (ICD-10-CM) - Upper back pain M54.6,G89.29 (ICD-10-CM) - Chronic bilateral thoracic back pain  Rationale for Evaluation and Treatment: Rehabilitation  THERAPY DIAG:  Pain in thoracic spine  Abnormal posture  Muscle weakness (generalized)  ONSET DATE: a couple months ago, sudden onset   SUBJECTIVE:                                                                                                                                                                                            SUBJECTIVE STATEMENT: Pt indicated soreness after last visit. Did not feel the manipulation helped.  PERTINENT HISTORY:  anxiety/depression, multiple MSK issues in past   PAIN:  Are you having pain: 5/10 Location: middle back bilateral slightly below shoulder blades How would you describe your pain? Aching pain typically, occasional sharp pain Aggravating factors: cold, prolonged sitting Easing factors: heating pad, medication, repositioning   PRECAUTIONS: None  WEIGHT BEARING RESTRICTIONS: No  FALLS:  Has patient fallen in last 6 months? No  LIVING ENVIRONMENT: With spouse and cat, 1 story, no STE  OCCUPATION: works  at [employer], has to lift heavy boxes of books  PLOF: Independent  PATIENT GOALS: reduce pain  OBJECTIVE:   DIAGNOSTIC FINDINGS:  06/25/2022 No recent imaging   PATIENT SURVEYS:  06/25/2022 FOTO 74% predicted 80%  SCREENING FOR RED FLAGS: 06/25/2022 Unremarkable for red flags  COGNITION: 06/25/2022 Overall cognitive status: Within functional limits for tasks assessed     SENSATION: 06/25/2022 Light touch intact all extremities    POSTURE:  06/25/2022 rounded shoulders, forward head, and increased thoracic kyphosis  PALPATION: 06/25/2022 TTP B middle/lower traps, rhomboids, lats. R infraspinatus/teres. Trigger points throughout Mild stiffness with grade 2-3 PA mobs throughout lower T spine, no overt pain, pt reports mild relief  CERVICAL ROM:   Active  A/PROM  eval  Flexion   Extension   Right lateral flexion   Left lateral flexion   Right rotation   Left rotation    (Blank rows = not tested)  Cervical ROM grossly WNL all planes, painless   LUMBAR ROM:   AROM 06/25/2022  Flexion 100% painless (distal shin)  Extension 100%  Right lateral flexion   Left lateral flexion   Right rotation 100% *  Left rotation 100% *    (Blank rows = not tested) Comments: increased stiffness and reduction in rotation B w/ hips fixed  compared to free hips, no change in pain  UPPER EXTREMITY ROM:  A/PROM Right eval Left eval  Shoulder flexion    Shoulder abduction    Shoulder internal rotation    Shoulder external rotation    Elbow flexion    Elbow extension    Wrist flexion    Wrist extension     (Blank rows = not tested) (Key: WFL = within functional limits not formally assessed, * = concordant pain, s = stiffness/stretching sensation, NT = not tested)  Comments:  06/25/2022 GH ROM grossly WNL flexion and abduction, painless   UPPER EXTREMITY MMT:  MMT Right 06/25/2022 Left 06/25/2022  Shoulder flexion 5 5  Shoulder extension    Shoulder abduction 5 5  Shoulder extension    Shoulder internal rotation 5 5  Shoulder external rotation 4* 4+  Elbow flexion    Elbow extension    Grip strength    (Blank rows = not tested)  (Key: WFL = within functional limits not formally assessed, * = concordant pain, s = stiffness/stretching sensation, NT = not tested)  Comments: of note, with HEP pt demonstrates notably reduced control of periscapular musculature with frequent compensations at thoracolumbar spine   GAIT: 06/25/2022 Distance walked: within clinic Assistive device utilized: None Level of assistance: Complete Independence Comments: WNL  TODAY'S TREATMENT:                                                                                                           DATE: 07/06/2022 Therapeutic Exercise: UBE L3 X 5 min switching half way Prone Y, 2 second hold X 10 bilat Prone T, 2 second hold X 10 bilat Standing bilat ER with red 2X10 Standing bilat Horizontal abduction with red 2X10 Standing  rows green 2X10 unilateral performed each side Standing extensions  green 2X10 unilateral performed each side Standing L stretch for lumbar flexion and thoracic extension 5 sec X 10   Manual Therapy: Manual therapy for skilled palpation and active tissue compression   Trigger Point Dry-Needling  Treatment  instructions: Expect mild to moderate muscle soreness. Patient Consent Given: Yes Education handout provided: Yes Muscles treated:Bilat thoracic paraspinals and multifidi, infraspinatus Treatment response/outcome: good overall tolerance,twitch response noted    TODAY'S TREATMENT:                                                                                                           DATE: 07/02/2022 Therapeutic Exercise: Prone scapular retraction 5 sec hold x 10 Prone scapular retraction c GH ext 5 sec hold x 10 Seated green band bilateral shoulder ER c scapular retraction x 10 Standing warrior pose thoracic rotation at wall c horizontal shoulder abduction without band x 5 bilateral, with green band x 10 bilateral UBE fwd/back 3 mins each way lvl 2.5   Review of techniques of new HEP additions.   Manual Therapy: Prone cPA G3 T5-T9, G5 regional rotation manip mid thoracic x 2, no cavitations noted, no adverse reactions reported/observed    TODAY'S TREATMENT:                                                                                                           DATE: 06/25/22 Therapeutic Exercise: Open chain scapular depression ~10 reps but tendency towards heavy compensations, opted for closed chain modification for more proprioceptive input Closed chain scap depression x10 cues for form and reduced compensations, HEP (press ups) Scap retraction + double ER x10 GTB, x10 RTB cues for form and HEP Manual Therapy: Prone: STM and trigger point release throughout periscapular musculature B    PATIENT EDUCATION:  Education details: Pt education on PT impairments, prognosis, and POC. Informed consent. Rationale for interventions, safe/appropriate HEP performance Person educated: Patient Education method: Explanation, Demonstration, Tactile cues, Verbal cues, and Handouts Education comprehension: verbalized understanding, returned demonstration, verbal cues required, tactile cues required,  and needs further education    HOME EXERCISE PROGRAM: Access Code: [email or web address] URL: [email or web address] Date: 07/02/2022 Prepared by: Chyrel Masson  Exercises - Seated Shoulder Press Ups Off Table  - 1-2 x daily - 7 x weekly - 3 sets - 10 reps - Shoulder External Rotation and Scapular Retraction with Resistance  - 1-2 x daily - 7 x weekly - 2-3 sets - 10 reps - Prone Scapular Retraction  - 2 x daily - 7 x weekly - 1 sets - 10 reps -  5 hold - Prone Scapular Slide with Shoulder Extension  - 2 x daily - 7 x weekly - 1 sets - 10 reps - 5 hold  ASSESSMENT:  CLINICAL IMPRESSION: She did not get significant benefit from thoracic manipulation so trialed DN today to her thoracic and scapular regions bilateral. She had good tolerance to this and we will assess her response to this next visit and repeat if desired. Continued with her scapular strength program today and worked on thoracic mobility.    OBJECTIVE IMPAIRMENTS: decreased activity tolerance, decreased mobility, decreased strength, increased muscle spasms, impaired UE functional use, postural dysfunction, and pain.   ACTIVITY LIMITATIONS: carrying, lifting, bending, sitting, reach over head, and locomotion level  PARTICIPATION LIMITATIONS: community activity, occupation, and recreational exercise  PERSONAL FACTORS: Time since onset of injury/illness/exacerbation and 1 comorbidity: anxiety/depression  are also affecting patient's functional outcome.   REHAB POTENTIAL: Good  CLINICAL DECISION MAKING: Stable/uncomplicated  EVALUATION COMPLEXITY: Low   GOALS: Goals reviewed with patient? No  SHORT TERM GOALS: Target date: 07/16/2022 Pt will demonstrate appropriate understanding and performance of initially prescribed HEP in order to facilitate improved independence with management of symptoms.  Baseline: HEP provided on eval Goal status: on gong 07/02/2022    LONG TERM GOALS: Target date: 08/06/2022 Pt will score 80%  on FOTO in order to demonstrate improved perception of functional status due to symptoms.  Baseline: 74% Goal status: INITIAL  2.  Pt will demonstrate full thoracolumbar rotation AROM without pain in order to demonstrate improved tolerance to functional movement patterns.  Baseline: see ROM chart above Goal status: INITIAL  3.  Pt will demonstrate grossly symmetrical in order to demonstrate improved strength for functional movements.  Baseline: see MMT chart above Goal status: INITIAL  4. Pt will report ability to sit for up to 1 hour with less than 2/10 pain on NPS for improved tolerance to daily activities  Baseline: up to 8/10 pain with sitting for an hour  Goal status: INITIAL   5. Pt will be able to run for up to 3 miles with less than 3/10 thoracic pain on NPS in order to return to PLOF and maximize health/QOL.    Baseline: up to 8/10 pain w/ running 3 miles   Goal status: INITIAL  PLAN:  PT FREQUENCY: 2x/week  PT DURATION: 6 weeks  PLANNED INTERVENTIONS: Therapeutic exercises, Therapeutic activity, Neuromuscular re-education, Balance training, Gait training, Patient/Family education, Self Care, Joint mobilization, Dry Needling, Electrical stimulation, Spinal mobilization, Cryotherapy, Moist heat, Taping, Manual therapy, and Re-evaluation. Above unless contraindicated.   PLAN FOR NEXT SESSION: How was DN from last time?  Elsie Ra, PT, DPT 07/06/22 3:24 PM

## 2022-07-07 ENCOUNTER — Other Ambulatory Visit: Payer: Self-pay | Admitting: Physical Medicine and Rehabilitation

## 2022-07-09 ENCOUNTER — Ambulatory Visit: Payer: BC Managed Care – PPO | Admitting: Rehabilitative and Restorative Service Providers"

## 2022-07-09 ENCOUNTER — Encounter: Payer: Self-pay | Admitting: Rehabilitative and Restorative Service Providers"

## 2022-07-09 DIAGNOSIS — M6281 Muscle weakness (generalized): Secondary | ICD-10-CM | POA: Diagnosis not present

## 2022-07-09 DIAGNOSIS — R293 Abnormal posture: Secondary | ICD-10-CM | POA: Diagnosis not present

## 2022-07-09 DIAGNOSIS — M546 Pain in thoracic spine: Secondary | ICD-10-CM | POA: Diagnosis not present

## 2022-07-09 NOTE — Therapy (Signed)
OUTPATIENT PHYSICAL THERAPY TREATMENT   Patient Name: Shelley Armstrong MRN: [id number] DOB:[birth year], 52 y.o., female Today's Date: 07/09/2022  END OF SESSION:  PT End of Session - 07/09/22 0830     Visit Number 4    Number of Visits 13    Date for PT Re-Evaluation 08/20/22    Authorization Type BCBS    PT Start Time 0830    PT Stop Time 0915    PT Time Calculation (min) 45 min    Activity Tolerance Patient tolerated treatment well    Behavior During Therapy [facility] for tasks assessed/performed               Past Medical History:  Diagnosis Date   Hypercholesterolemia    Past Surgical History:  Procedure Laterality Date   ROBOTIC ASSISTED LAP VAGINAL HYSTERECTOMY  11/2015   has ovaries   Patient Active Problem List   Diagnosis Date Noted   Greater trochanteric pain syndrome of left lower extremity 09/23/2021   Labral tear of hip, degenerative 09/15/2021   Patellofemoral pain syndrome of both knees 05/13/2021   Scapular dysfunction 08/13/2020   Right foot pain 06/11/2019   Back pain 04/21/2017   Low back pain radiating to right leg 02/15/2017   Hereditary and idiopathic peripheral neuropathy 04/19/2016   Long term use of drug 08/13/2014   Major depression, recurrent ([place]) 10/24/2013   Generalized anxiety disorder 02/27/2013   Hyperlipidemia 12/02/2012    PCP: Glendon Axe, MD  REFERRING PROVIDER: Lorine Bears, NP  REFERRING DIAG: M79.18 (ICD-10-CM) - Myofascial pain syndrome M54.9 (ICD-10-CM) - Upper back pain M54.6,G89.29 (ICD-10-CM) - Chronic bilateral thoracic back pain  Rationale for Evaluation and Treatment: Rehabilitation  THERAPY DIAG:  Pain in thoracic spine  Abnormal posture  Muscle weakness (generalized)  ONSET DATE: a couple months ago, sudden onset   SUBJECTIVE:                                                                                                                                                                                            SUBJECTIVE STATEMENT: She reported feeling like there was improvement from dry needling visit last time. Still noticed some trouble at work with symptoms despite adjusting positions.   PERTINENT HISTORY:  anxiety/depression, multiple MSK issues in past   PAIN:  Are you having pain: 4/10 Location: Lt middle  How would you describe your pain? Aching pain typically, occasional sharp pain Aggravating factors: cold, prolonged sitting Easing factors: heating pad, medication, repositioning   PRECAUTIONS: None  WEIGHT BEARING RESTRICTIONS: No  FALLS:  Has patient fallen in last 6 months? No  LIVING ENVIRONMENT: With  spouse and cat, 1 story, no STE  OCCUPATION: works at [employer], has to lift heavy boxes of books  PLOF: Independent  PATIENT GOALS: reduce pain  OBJECTIVE:   DIAGNOSTIC FINDINGS:  06/25/2022 No recent imaging   PATIENT SURVEYS:  06/25/2022 FOTO 74% predicted 80%  SCREENING FOR RED FLAGS: 06/25/2022 Unremarkable for red flags  COGNITION: 06/25/2022 Overall cognitive status: Within functional limits for tasks assessed     SENSATION: 06/25/2022 Light touch intact all extremities   POSTURE:  06/25/2022 rounded shoulders, forward head, and increased thoracic kyphosis  PALPATION: 07/09/2022:  Trigger points c concordant symptoms in Lt mid thoracic paraspinals, Lt infraspinatus.   06/25/2022 TTP B middle/lower traps, rhomboids, lats. R infraspinatus/teres. Trigger points throughout Mild stiffness with grade 2-3 PA mobs throughout lower T spine, no overt pain, pt reports mild relief  Thoracic ROM:   Active  AROM  eval  Flexion   Extension   Right lateral flexion   Left lateral flexion   Right rotation   Left rotation    (Blank rows = not tested)  Cervical ROM grossly WNL all planes, painless   LUMBAR ROM:   AROM 06/25/2022  Flexion 100% painless (distal shin)  Extension 100%  Right lateral flexion   Left lateral flexion   Right  rotation 100% *  Left rotation 100% *    (Blank rows = not tested) Comments: increased stiffness and reduction in rotation B w/ hips fixed compared to free hips, no change in pain  UPPER EXTREMITY ROM:  A/PROM Right eval Left eval  Shoulder flexion    Shoulder abduction    Shoulder internal rotation    Shoulder external rotation    Elbow flexion    Elbow extension    Wrist flexion    Wrist extension     (Blank rows = not tested) (Key: WFL = within functional limits not formally assessed, * = concordant pain, s = stiffness/stretching sensation, NT = not tested)  Comments:  06/25/2022 GH ROM grossly WNL flexion and abduction, painless   UPPER EXTREMITY MMT:  MMT Right 06/25/2022 Left 06/25/2022  Shoulder flexion 5 5  Shoulder extension    Shoulder abduction 5 5  Shoulder extension    Shoulder internal rotation 5 5  Shoulder external rotation 4* 4+  Elbow flexion    Elbow extension    Grip strength    (Blank rows = not tested)  (Key: WFL = within functional limits not formally assessed, * = concordant pain, s = stiffness/stretching sensation, NT = not tested)  Comments: of note, with HEP pt demonstrates notably reduced control of periscapular musculature with frequent compensations at thoracolumbar spine   GAIT: 06/25/2022 Distance walked: within clinic Assistive device utilized: None Level of assistance: Complete Independence Comments: WNL  TODAY'S TREATMENT:                                                                                                           DATE: 07/09/2022 Manual Therapy: Manual therapy for skilled palpation and active tissue compression   Trigger Point  Dry-Needling  Treatment instructions: Expect mild to moderate muscle soreness. Patient Consent Given: Yes Education handout provided: Previously Muscles treated: Lt mid thoracic paraspinals/multifidi, Lt infraspinatus Treatment response/outcome: good overall tolerance,twitch response noted    Therapeutic Exercise: Standing green tband rows 2 x 15 Standing green tband gh ext 2 x 15 UBE Lvl 3 min fwd/back  Seated cervical retraction into ball at wall 5 sec hold x 10   Reviewed y's and t's from last visit.  Education on sitting posture c alignment cues for work.      TODAY'S TREATMENT:                                                                                                           DATE: 07/06/2022 Therapeutic Exercise: UBE L3 X 5 min switching half way Prone Y, 2 second hold X 10 bilat Prone T, 2 second hold X 10 bilat Standing bilat ER with red 2X10 Standing bilat Horizontal abduction with red 2X10 Standing rows green 2X10 unilateral performed each side Standing extensions  green 2X10 unilateral performed each side Standing L stretch for lumbar flexion and thoracic extension 5 sec X 10   Manual Therapy: Manual therapy for skilled palpation and active tissue compression   Trigger Point Dry-Needling  Treatment instructions: Expect mild to moderate muscle soreness. Patient Consent Given: Yes Education handout provided: Yes Muscles treated:Bilat thoracic paraspinals and multifidi, infraspinatus Treatment response/outcome: good overall tolerance,twitch response noted    TODAY'S TREATMENT:                                                                                                           DATE: 07/02/2022 Therapeutic Exercise: Prone scapular retraction 5 sec hold x 10 Prone scapular retraction c GH ext 5 sec hold x 10 Seated green band bilateral shoulder ER c scapular retraction x 10 Standing warrior pose thoracic rotation at wall c horizontal shoulder abduction without band x 5 bilateral, with green band x 10 bilateral UBE fwd/back 3 mins each way lvl 2.5   Review of techniques of new HEP additions.   Manual Therapy: Prone cPA G3 T5-T9, G5 regional rotation manip mid thoracic x 2, no cavitations noted, no adverse reactions reported/observed     PATIENT EDUCATION:  Education details: Pt education on PT impairments, prognosis, and POC. Informed consent. Rationale for interventions, safe/appropriate HEP performance Person educated: Patient Education method: Explanation, Demonstration, Tactile cues, Verbal cues, and Handouts Education comprehension: verbalized understanding, returned demonstration, verbal cues required, tactile cues required, and needs further education    HOME EXERCISE PROGRAM: Access Code: [email or web address] URL: [email or web address] Date: 07/02/2022 Prepared by: Casimiro Needle  [facility]  Exercises - Seated Shoulder Press Ups Off Table  - 1-2 x daily - 7 x weekly - 3 sets - 10 reps - Shoulder External Rotation and Scapular Retraction with Resistance  - 1-2 x daily - 7 x weekly - 2-3 sets - 10 reps - Prone Scapular Retraction  - 2 x daily - 7 x weekly - 1 sets - 10 reps - 5 hold - Prone Scapular Slide with Shoulder Extension  - 2 x daily - 7 x weekly - 1 sets - 10 reps - 5 hold  ASSESSMENT:  CLINICAL IMPRESSION:  Due to improvements reported from last visit, repeated dry needling for impacted areas today (more Lt side).  Continued emphasis on positional changes while at work and use of HEP for strengthening to help improve tolerance to activity.   Soreness noted as expected immediately after manual and dry needling intervention.  Continued education on importance of increased HEP movement today as well as heat/ice as desired.    OBJECTIVE IMPAIRMENTS: decreased activity tolerance, decreased mobility, decreased strength, increased muscle spasms, impaired UE functional use, postural dysfunction, and pain.   ACTIVITY LIMITATIONS: carrying, lifting, bending, sitting, reach over head, and locomotion level  PARTICIPATION LIMITATIONS: community activity, occupation, and recreational exercise  PERSONAL FACTORS: Time since onset of injury/illness/exacerbation and 1 comorbidity: anxiety/depression  are also affecting  patient's functional outcome.   REHAB POTENTIAL: Good  CLINICAL DECISION MAKING: Stable/uncomplicated  EVALUATION COMPLEXITY: Low   GOALS: Goals reviewed with patient? No  SHORT TERM GOALS: Target date: 07/16/2022 Pt will demonstrate appropriate understanding and performance of initially prescribed HEP in order to facilitate improved independence with management of symptoms.  Baseline: HEP provided on eval Goal status: on gong 07/02/2022    LONG TERM GOALS: Target date: 08/06/2022 Pt will score 80% on FOTO in order to demonstrate improved perception of functional status due to symptoms.  Baseline: 74% Goal status: INITIAL  2.  Pt will demonstrate full thoracolumbar rotation AROM without pain in order to demonstrate improved tolerance to functional movement patterns.  Baseline: see ROM chart above Goal status: INITIAL  3.  Pt will demonstrate grossly symmetrical in order to demonstrate improved strength for functional movements.  Baseline: see MMT chart above Goal status: INITIAL  4. Pt will report ability to sit for up to 1 hour with less than 2/10 pain on NPS for improved tolerance to daily activities  Baseline: up to 8/10 pain with sitting for an hour  Goal status: INITIAL   5. Pt will be able to run for up to 3 miles with less than 3/10 thoracic pain on NPS in order to return to PLOF and maximize health/QOL.    Baseline: up to 8/10 pain w/ running 3 miles   Goal status: INITIAL  PLAN:  PT FREQUENCY: 2x/week  PT DURATION: 6 weeks  PLANNED INTERVENTIONS: Therapeutic exercises, Therapeutic activity, Neuromuscular re-education, Balance training, Gait training, Patient/Family education, Self Care, Joint mobilization, Dry Needling, Electrical stimulation, Spinal mobilization, Cryotherapy, Moist heat, Taping, Manual therapy, and Re-evaluation. Above unless contraindicated.   PLAN FOR NEXT SESSION: DN as desired, progressive postural strengthening.   Chyrel Masson, PT, DPT,  OCS, ATC 07/09/22  9:17 AM

## 2022-07-13 ENCOUNTER — Encounter: Payer: Self-pay | Admitting: Physical Therapy

## 2022-07-13 ENCOUNTER — Ambulatory Visit: Payer: BC Managed Care – PPO | Admitting: Physical Therapy

## 2022-07-13 ENCOUNTER — Ambulatory Visit (INDEPENDENT_AMBULATORY_CARE_PROVIDER_SITE_OTHER): Payer: BC Managed Care – PPO | Admitting: Physical Medicine and Rehabilitation

## 2022-07-13 ENCOUNTER — Encounter: Payer: Self-pay | Admitting: Physical Medicine and Rehabilitation

## 2022-07-13 DIAGNOSIS — M7918 Myalgia, other site: Secondary | ICD-10-CM | POA: Diagnosis not present

## 2022-07-13 DIAGNOSIS — M546 Pain in thoracic spine: Secondary | ICD-10-CM

## 2022-07-13 DIAGNOSIS — M6281 Muscle weakness (generalized): Secondary | ICD-10-CM

## 2022-07-13 DIAGNOSIS — R293 Abnormal posture: Secondary | ICD-10-CM

## 2022-07-13 DIAGNOSIS — M549 Dorsalgia, unspecified: Secondary | ICD-10-CM | POA: Diagnosis not present

## 2022-07-13 DIAGNOSIS — G8929 Other chronic pain: Secondary | ICD-10-CM | POA: Diagnosis not present

## 2022-07-13 NOTE — Therapy (Signed)
OUTPATIENT PHYSICAL THERAPY TREATMENT   Patient Name: Shelley Armstrong MRN: [id number] DOB:03-02-1971, 52 y.o., female Today's Date: 07/13/2022  END OF SESSION:  PT End of Session - 07/13/22 1429     Visit Number 5    Number of Visits 13    Date for PT Re-Evaluation 08/20/22    Authorization Type BC[employer]    PT Start Time 1346    PT Stop Time 1426    PT Time Calculation (min) 40 min    Activity Tolerance Patient tolerated treatment well    Behavior During Therapy [facility] for tasks assessed/performed                Past Medical History:  Diagnosis Date   Hypercholesterolemia    Past Surgical History:  Procedure Laterality Date   ROBOTIC ASSISTED LAP VAGINAL HYSTERECTOMY  11/2015   has ovaries   Patient Active Problem List   Diagnosis Date Noted   Greater trochanteric pain syndrome of left lower extremity 09/23/2021   Labral tear of hip, degenerative 09/15/2021   Patellofemoral pain syndrome of both knees 05/13/2021   Scapular dysfunction 08/13/2020   Right foot pain 06/11/2019   Back pain 04/21/2017   Low back pain radiating to right leg 02/15/2017   Hereditary and idiopathic peripheral neuropathy 04/19/2016   Long term use of drug 08/13/2014   Major depression, recurrent ([place]) 10/24/2013   Generalized anxiety disorder 02/27/2013   Hyperlipidemia 12/02/2012    PCP: Glendon Axe, MD  REFERRING PROVIDER: Lorine Bears, NP  REFERRING DIAG: M79.18 (ICD-10-CM) - Myofascial pain syndrome M54.9 (ICD-10-CM) - Upper back pain M54.6,G89.29 (ICD-10-CM) - Chronic bilateral thoracic back pain  Rationale for Evaluation and Treatment: Rehabilitation  THERAPY DIAG:  Pain in thoracic spine  Abnormal posture  Muscle weakness (generalized)  ONSET DATE: a couple months ago, sudden onset   SUBJECTIVE:                                                                                                                                                                                            SUBJECTIVE STATEMENT: She reports she does not have as many tender/painful spots today.   PERTINENT HISTORY:  anxiety/depression, multiple MSK issues in past   PAIN:  Are you having pain: 3/10 Location: Lt middle  How would you describe your pain? Aching pain typically, occasional sharp pain Aggravating factors: cold, prolonged sitting Easing factors: heating pad, medication, repositioning   PRECAUTIONS: None  WEIGHT BEARING RESTRICTIONS: No  FALLS:  Has patient fallen in last 6 months? 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, has to lift heavy boxes of books  PLOF: Independent  PATIENT GOALS: reduce pain  OBJECTIVE:   DIAGNOSTIC FINDINGS:  06/25/2022 No recent imaging   PATIENT SURVEYS:  06/25/2022 FOTO 74% predicted 80%  SCREENING FOR RED FLAGS: 06/25/2022 Unremarkable for red flags  COGNITION: 06/25/2022 Overall cognitive status: Within functional limits for tasks assessed     SENSATION: 06/25/2022 Light touch intact all extremities   POSTURE:  06/25/2022 rounded shoulders, forward head, and increased thoracic kyphosis  PALPATION: 07/09/2022:  Trigger points c concordant symptoms in Lt mid thoracic paraspinals, Lt infraspinatus.   06/25/2022 TTP B middle/lower traps, rhomboids, lats. R infraspinatus/teres. Trigger points throughout Mild stiffness with grade 2-3 PA mobs throughout lower T spine, no overt pain, pt reports mild relief  Thoracic ROM:   Active  AROM  eval  Flexion   Extension   Right lateral flexion   Left lateral flexion   Right rotation   Left rotation    (Blank rows = not tested)  Cervical ROM grossly WNL all planes, painless   LUMBAR ROM:   AROM 06/25/2022  Flexion 100% painless (distal shin)  Extension 100%  Right lateral flexion   Left lateral flexion   Right rotation 100% *  Left rotation 100% *    (Blank rows = not tested) Comments: increased  stiffness and reduction in rotation B w/ hips fixed compared to free hips, no change in pain  UPPER EXTREMITY ROM:  A/PROM Right eval Left eval  Shoulder flexion    Shoulder abduction    Shoulder internal rotation    Shoulder external rotation    Elbow flexion    Elbow extension    Wrist flexion    Wrist extension     (Blank rows = not tested) (Key: WFL = within functional limits not formally assessed, * = concordant pain, s = stiffness/stretching sensation, NT = not tested)  Comments:  06/25/2022 GH ROM grossly WNL flexion and abduction, painless   UPPER EXTREMITY MMT:  MMT Right 06/25/2022 Left 06/25/2022  Shoulder flexion 5 5  Shoulder extension    Shoulder abduction 5 5  Shoulder extension    Shoulder internal rotation 5 5  Shoulder external rotation 4* 4+  Elbow flexion    Elbow extension    Grip strength    (Blank rows = not tested)  (Key: WFL = within functional limits not formally assessed, * = concordant pain, s = stiffness/stretching sensation, NT = not tested)  Comments: of note, with HEP pt demonstrates notably reduced control of periscapular musculature with frequent compensations at thoracolumbar spine   GAIT: 06/25/2022 Distance walked: within clinic Assistive device utilized: None Level of assistance: Complete Independence Comments: WNL  TODAY'S TREATMENT:                                                                                                           DATE: 07/13/2022 Manual Therapy: Manual therapy for skilled palpation and active tissue compression   Trigger Point Dry-Needling  Treatment instructions: Expect mild to moderate muscle soreness. Patient Consent  Given: Yes Education handout provided: Previously Muscles treated:  Lt infraspinatus Treatment response/outcome: good overall tolerance,twitch response noted   Therapeutic Exercise: UBE Lvl 3 min fwd/back  Seated row machine #20 2x10 Seated lat machine #20 first set wide grip x10,  second set close x10 Standing cable machine 5# each side, gh ext one arm at a time  x 15 each arm Standing horizontal abduction green 2X10 bilat Standing bilat ER green 2X10  Standing wall slides then moving into thoracic extension/shoulder hyperflexion X 10 bilat Low to high row 10 #KB X 10 bilat with one UE support Seated cervical retraction into ball at wall 5 sec hold x 10    DATE: 07/09/2022 Manual Therapy: Manual therapy for skilled palpation and active tissue compression   Trigger Point Dry-Needling  Treatment instructions: Expect mild to moderate muscle soreness. Patient Consent Given: Yes Education handout provided: Previously Muscles treated: Lt mid thoracic paraspinals/multifidi, Lt infraspinatus Treatment response/outcome: good overall tolerance,twitch response noted    Therapeutic Exercise: Standing green tband rows 2 x 15 Standing green tband gh ext 2 x 15 UBE Lvl 3 min fwd/back  Seated cervical retraction into ball at wall 5 sec hold x 10    Reviewed y's and t's from last visit.  Education on sitting posture c alignment cues for work.       PATIENT EDUCATION:  Education details: Pt education on PT impairments, prognosis, and POC. Informed consent. Rationale for interventions, safe/appropriate HEP performance Person educated: Patient Education method: Explanation, Demonstration, Tactile cues, Verbal cues, and Handouts Education comprehension: verbalized understanding, returned demonstration, verbal cues required, tactile cues required, and needs further education    HOME EXERCISE PROGRAM: Access Code: [email or web address] URL: [email or web address] Date: 07/02/2022 Prepared by: Chyrel Masson  Exercises - Seated Shoulder Press Ups Off Table  - 1-2 x daily - 7 x weekly - 3 sets - 10 reps - Shoulder External Rotation and Scapular Retraction with Resistance  - 1-2 x daily - 7 x weekly - 2-3 sets - 10 reps - Prone Scapular Retraction  - 2 x daily - 7 x weekly  - 1 sets - 10 reps - 5 hold - Prone Scapular Slide with Shoulder Extension  - 2 x daily - 7 x weekly - 1 sets - 10 reps - 5 hold  ASSESSMENT:  CLINICAL IMPRESSION:  She only had one triggerpoint noted in left infraspinatus, PT was not able to palpate any others so this is likely a sign of improvement from DN. We progressed her scapular and postural strength program adding in some of the resistance machines and she had good tolerance to this.    OBJECTIVE IMPAIRMENTS: decreased activity tolerance, decreased mobility, decreased strength, increased muscle spasms, impaired UE functional use, postural dysfunction, and pain.   ACTIVITY LIMITATIONS: carrying, lifting, bending, sitting, reach over head, and locomotion level  PARTICIPATION LIMITATIONS: community activity, occupation, and recreational exercise  PERSONAL FACTORS: Time since onset of injury/illness/exacerbation and 1 comorbidity: anxiety/depression  are also affecting patient's functional outcome.   REHAB POTENTIAL: Good  CLINICAL DECISION MAKING: Stable/uncomplicated  EVALUATION COMPLEXITY: Low   GOALS: Goals reviewed with patient? No  SHORT TERM GOALS: Target date: 07/16/2022 Pt will demonstrate appropriate understanding and performance of initially prescribed HEP in order to facilitate improved independence with management of symptoms.  Baseline: HEP provided on eval Goal status: on gong 07/02/2022    LONG TERM GOALS: Target date: 08/06/2022 Pt will score 80% on FOTO in order to demonstrate improved perception of functional status due  to symptoms.  Baseline: 74% Goal status: INITIAL  2.  Pt will demonstrate full thoracolumbar rotation AROM without pain in order to demonstrate improved tolerance to functional movement patterns.  Baseline: see ROM chart above Goal status: INITIAL  3.  Pt will demonstrate grossly symmetrical in order to demonstrate improved strength for functional movements.  Baseline: see MMT chart  above Goal status: INITIAL  4. Pt will report ability to sit for up to 1 hour with less than 2/10 pain on NPS for improved tolerance to daily activities  Baseline: up to 8/10 pain with sitting for an hour  Goal status: INITIAL   5. Pt will be able to run for up to 3 miles with less than 3/10 thoracic pain on NPS in order to return to PLOF and maximize health/QOL.    Baseline: up to 8/10 pain w/ running 3 miles   Goal status: INITIAL  PLAN:  PT FREQUENCY: 2x/week  PT DURATION: 6 weeks  PLANNED INTERVENTIONS: Therapeutic exercises, Therapeutic activity, Neuromuscular re-education, Balance training, Gait training, Patient/Family education, Self Care, Joint mobilization, Dry Needling, Electrical stimulation, Spinal mobilization, Cryotherapy, Moist heat, Taping, Manual therapy, and Re-evaluation. Above unless contraindicated.   PLAN FOR NEXT SESSION: DN as desired, progressive postural strengthening.   Ivery Quale, PT, DPT 07/13/22 2:31 PM

## 2022-07-13 NOTE — Progress Notes (Signed)
Shelley Armstrong - 52 y.o. female MRN [id number]  Date of birth: Jan 29, 1971  Office Visit Note: Visit Date: 07/13/2022 PCP: Glendon Axe, MD Referred by: Glendon Axe, MD  Subjective: Chief Complaint  Patient presents with   Middle Back - Pain   HPI: Shelley Armstrong is a 52 y.o. female who comes in today for evaluation of chronic bilateral thoracic back pain radiating to upper back. Pain ongoing for several years and worsens with activity, movement, sitting and laying flat. She describes her pain as sore and tight, currently rates as 3 out of 10. Some relief of pain with home exercise regimen, rest and medications. She is currently undergoing formal physical therapy/dry needling with our in house team, she reports significant relief of pain with these treatments. She continues with Meloxicam and feels this medication is also helping to alleviate her pain. Thoracic MRI imaging from 2020 exhibits shallow central protrusion at T5-T6 without central canal or foraminal narrowing, there is also shallow broad based right paracentral disc protrusion at T7-T8 without canal or foraminal stenosis. No high grade spinal canal stenosis noted. Patient denies focal weakness, numbness and tingling. No recent trauma or falls.    Review of Systems  Musculoskeletal:  Positive for back pain and myalgias.  Neurological:  Negative for tingling, sensory change, focal weakness and weakness.  All other systems reviewed and are negative.  Otherwise per HPI.  Assessment & Plan: Visit Diagnoses:    ICD-10-CM   1. Chronic bilateral thoracic back pain  M54.6    G89.29     2. Upper back pain  M54.9     3. Myofascial pain syndrome  M79.18        Plan: Findings:  Chronic bilateral thoracic back pain radiating to upper back. Significant and sustained relief of pain with recent formal physical therapy/dry needling treatments. She continues with conservative therapies and Meloxicam. Patients clinical presentation and exam are  consistent with myofascial pain syndrome. She is particularly tender to touch, multiple palpable trigger points noted to bilateral thoracic paraspinal regions and trapezius/rhomboid regions. I also feel there could be a type of central sensitization syndrome such as fibromyalgia contributing to her pain. Patient has undergone 3 treatments of physical therapy, plan is for her to continue with therapy as directed. If her pain increases or worsens we would consider repeating thoracic MRI imaging and/or performing thoracic epidural steroid injection. Patient instructed to let us know how she is doing post physical therapy. No red flag symptoms noted upon exam today.     Meds & Orders: No orders of the defined types were placed in this encounter.  No orders of the defined types were placed in this encounter.   Follow-up: Return if symptoms worsen or fail to improve.   Procedures: No procedures performed      Clinical History: T7-8: The patient has a very shallow broad-based right paracentral protrusion which is new since the prior exam. No stenosis.   Except as noted above, intervertebral discs are unremarkable. The central canal and foramina are widely patent throughout.   IMPRESSION: No finding to explain the patient's symptoms.   No change in a shallow central protrusion at T5-6 without central canal or foraminal narrowing.   Very shallow broad-based right paracentral protrusion at T7-8 is new since the prior MRI but the central canal and foramina are widely patent.     Electronically Signed   By: Inge Rise M.D.   On: 08/25/2018 14:49   She reports that she has  never smoked. She has never used smokeless tobacco. No results for input(s): "HGBA1C", "LABURIC" in the last 8760 hours.  Objective:  VS:  HT:    WT:   BMI:     BP:   HR: bpm  TEMP: ( )  RESP:  Physical Exam Vitals and nursing note reviewed.  HENT:     Head: Normocephalic and atraumatic.     Right Ear:  External ear normal.     Left Ear: External ear normal.     Nose: Nose normal.     Mouth/Throat:     Mouth: Mucous membranes are moist.  Eyes:     Extraocular Movements: Extraocular movements intact.  Cardiovascular:     Rate and Rhythm: Normal rate.     Pulses: Normal pulses.  Pulmonary:     Effort: Pulmonary effort is normal.  Abdominal:     General: Abdomen is flat. There is no distension.  Musculoskeletal:        General: Tenderness present.     Cervical back: Normal range of motion.     Comments: Pt rises from seated position to standing without difficulty. Good lumbar range of motion. Strong distal strength without clonus, no pain upon palpation of greater trochanters. Sensation intact bilaterally. Multiple palpable trigger points noted to bilateral thoracic paraspinal regions and bilateral trapezius and rhomboid muscles. Walks independently, gait steady.  Skin:    General: Skin is warm and dry.     Capillary Refill: Capillary refill takes less than 2 seconds.  Neurological:     General: No focal deficit present.     Mental Status: She is alert and oriented to person, place, and time.  Psychiatric:        Mood and Affect: Mood normal.        Behavior: Behavior normal.     Ortho Exam  Imaging: No results found.  Past Medical/Family/Surgical/Social History: Medications & Allergies reviewed per EMR, new medications updated. Patient Active Problem List   Diagnosis Date Noted   Greater trochanteric pain syndrome of left lower extremity 09/23/2021   Labral tear of hip, degenerative 09/15/2021   Patellofemoral pain syndrome of both knees 05/13/2021   Scapular dysfunction 08/13/2020   Right foot pain 06/11/2019   Back pain 04/21/2017   Low back pain radiating to right leg 02/15/2017   Hereditary and idiopathic peripheral neuropathy 04/19/2016   Long term use of drug 08/13/2014   Major depression, recurrent ([place]) 10/24/2013   Generalized anxiety disorder 02/27/2013    Hyperlipidemia 12/02/2012   Past Medical History:  Diagnosis Date   Hypercholesterolemia    Family History  Problem Relation Age of Onset   Cancer Father    Alzheimer's disease Father    Multiple sclerosis Brother    Past Surgical History:  Procedure Laterality Date   ROBOTIC ASSISTED LAP VAGINAL HYSTERECTOMY  11/2015   has ovaries   Social History   Occupational History    Comment: home maker  Tobacco Use   Smoking status: Never   Smokeless tobacco: Never  Substance and Sexual Activity   Alcohol use: No   Drug use: No   Sexual activity: Not on file

## 2022-07-13 NOTE — Progress Notes (Signed)
Followup upper back and bilateral shoulder pain. States she has been going to PT and they have been dry needling which has been helpful

## 2022-07-15 ENCOUNTER — Ambulatory Visit: Payer: BC Managed Care – PPO | Admitting: Rehabilitative and Restorative Service Providers"

## 2022-07-15 ENCOUNTER — Encounter: Payer: Self-pay | Admitting: Rehabilitative and Restorative Service Providers"

## 2022-07-15 DIAGNOSIS — R293 Abnormal posture: Secondary | ICD-10-CM

## 2022-07-15 DIAGNOSIS — M546 Pain in thoracic spine: Secondary | ICD-10-CM | POA: Diagnosis not present

## 2022-07-15 DIAGNOSIS — M6281 Muscle weakness (generalized): Secondary | ICD-10-CM | POA: Diagnosis not present

## 2022-07-15 NOTE — Therapy (Signed)
OUTPATIENT PHYSICAL THERAPY TREATMENT   Patient Name: Shelley Armstrong MRN: [id number] DOB:10/23/1970, 52 y.o., female Today's Date: 07/15/2022  END OF SESSION:  PT End of Session - 07/15/22 1257     Visit Number 6    Number of Visits 13    Date for PT Re-Evaluation 08/20/22    Authorization Type BCBS    PT Start Time 1258    PT Stop Time 1336    PT Time Calculation (min) 38 min    Activity Tolerance Patient tolerated treatment well    Behavior During Therapy [facility] for tasks assessed/performed                 Past Medical History:  Diagnosis Date   Hypercholesterolemia    Past Surgical History:  Procedure Laterality Date   ROBOTIC ASSISTED LAP VAGINAL HYSTERECTOMY  11/2015   has ovaries   Patient Active Problem List   Diagnosis Date Noted   Greater trochanteric pain syndrome of left lower extremity 09/23/2021   Labral tear of hip, degenerative 09/15/2021   Patellofemoral pain syndrome of both knees 05/13/2021   Scapular dysfunction 08/13/2020   Right foot pain 06/11/2019   Back pain 04/21/2017   Low back pain radiating to right leg 02/15/2017   Hereditary and idiopathic peripheral neuropathy 04/19/2016   Long term use of drug 08/13/2014   Major depression, recurrent (HCC) 10/24/2013   Generalized anxiety disorder 02/27/2013   Hyperlipidemia 12/02/2012    PCP: Caffie Damme, MD  REFERRING PROVIDER: Juanda Chance, NP  REFERRING DIAG: M79.18 (ICD-10-CM) - Myofascial pain syndrome M54.9 (ICD-10-CM) - Upper back pain M54.6,G89.29 (ICD-10-CM) - Chronic bilateral thoracic back pain  Rationale for Evaluation and Treatment: Rehabilitation  THERAPY DIAG:  Pain in thoracic spine  Abnormal posture  Muscle weakness (generalized)  ONSET DATE: a couple months ago, sudden onset   SUBJECTIVE:                                                                                                                                                                                            SUBJECTIVE STATEMENT: She indicated doing fairly well since last visit.  No real pain increases reported.  Feeling like she had gotten some improvement.   PERTINENT HISTORY:  anxiety/depression, multiple MSK issues in past   PAIN:  Are you having pain: at worst 4/10 Location: Lt middle  How would you describe your pain? Aching pain typically, occasional sharp pain Aggravating factors: cold, prolonged sitting Easing factors: heating pad, medication, repositioning   PRECAUTIONS: None  WEIGHT BEARING RESTRICTIONS: No  FALLS:  Has patient fallen in last 6 months? No  LIVING  ENVIRONMENT: With spouse and cat, 1 story, no STE  OCCUPATION: works at [employer], has to lift heavy boxes of books  PLOF: Independent  PATIENT GOALS: reduce pain  OBJECTIVE:   DIAGNOSTIC FINDINGS:  06/25/2022 No recent imaging   PATIENT SURVEYS:   06/25/2022 FOTO 74% predicted 80%  SCREENING FOR RED FLAGS: 06/25/2022 Unremarkable for red flags  COGNITION: 06/25/2022 Overall cognitive status: Within functional limits for tasks assessed     SENSATION: 06/25/2022 Light touch intact all extremities   POSTURE:  06/25/2022 rounded shoulders, forward head, and increased thoracic kyphosis  PALPATION: 07/09/2022:  Trigger points c concordant symptoms in Lt mid thoracic paraspinals, Lt infraspinatus.   06/25/2022 TTP B middle/lower traps, rhomboids, lats. R infraspinatus/teres. Trigger points throughout Mild stiffness with grade 2-3 PA mobs throughout lower T spine, no overt pain, pt reports mild relief  Thoracic ROM:   Active  AROM  eval  Flexion   Extension   Right lateral flexion   Left lateral flexion   Right rotation   Left rotation    (Blank rows = not tested)  Cervical ROM grossly WNL all planes, painless   LUMBAR ROM:   AROM 06/25/2022  Flexion 100% painless (distal shin)  Extension 100%  Right lateral flexion   Left lateral flexion   Right rotation 100%  *  Left rotation 100% *    (Blank rows = not tested) Comments: increased stiffness and reduction in rotation B w/ hips fixed compared to free hips, no change in pain  UPPER EXTREMITY ROM:  A/PROM Right eval Left eval  Shoulder flexion    Shoulder abduction    Shoulder internal rotation    Shoulder external rotation    Elbow flexion    Elbow extension    Wrist flexion    Wrist extension     (Blank rows = not tested) (Key: WFL = within functional limits not formally assessed, * = concordant pain, s = stiffness/stretching sensation, NT = not tested)  Comments:  06/25/2022 GH ROM grossly WNL flexion and abduction, painless   UPPER EXTREMITY MMT:  MMT Right 06/25/2022 Left 06/25/2022 Right 07/15/2022 Left 07/15/2022  Shoulder flexion 5 5 5/5 5/5  Shoulder extension      Shoulder abduction 5 5 5/5 5/55  Shoulder extension      Shoulder internal rotation 5 5 5/5 5/5  Shoulder external rotation 4* 4+ 5/5 5/5  Elbow flexion      Elbow extension      Grip strength      (Blank rows = not tested)  (Key: WFL = within functional limits not formally assessed, * = concordant pain, s = stiffness/stretching sensation, NT = not tested)  Comments: of note, with HEP pt demonstrates notably reduced control of periscapular musculature with frequent compensations at thoracolumbar spine   GAIT: 06/25/2022 Distance walked: within clinic Assistive device utilized: None Level of assistance: Complete Independence Comments: WNL   TODAY'S TREATMENT:  DATE: 07/15/2022 Therapeutic Exercise: UBE Lvl 3 4  min fwd/back each way with faster interval end of each minute :50-:60 self selected Seated row machine #20 2x15  (seated upper trap self stretch after sets 15 sec x 3 bilateral) Seated lat machine #20  2 x10 Standing blue band GH ext past legs 2-3 sec hold 2 x 10  Serratus anterior forearm  wall slide into flexion on foam roller x 10, on small pball x 10  Standing bilat ER green 2 x 15  Supine UC Flexion hold 5 sec x 10 Supine scapular retraction into table 5 sec hold x 10  Supine bilateral UE extension into table 5 sec hold x 10     TODAY'S TREATMENT:                                                                                                           DATE: 07/13/2022 Manual Therapy: Manual therapy for skilled palpation and active tissue compression   Trigger Point Dry-Needling  Treatment instructions: Expect mild to moderate muscle soreness. Patient Consent Given: Yes Education handout provided: Previously Muscles treated:  Lt infraspinatus Treatment response/outcome: good overall tolerance,twitch response noted   Therapeutic Exercise: UBE Lvl 3 min fwd/back  Seated row machine #20 2x10 Seated lat machine #20 first set wide grip x10, second set close x10 Standing cable machine 5# each side, gh ext one arm at a time  x 15 each arm Standing horizontal abduction green 2X10 bilat Standing bilat ER green 2X10  Standing wall slides then moving into thoracic extension/shoulder hyperflexion X 10 bilat Low to high row 10 #KB X 10 bilat with one UE support Seated cervical retraction into ball at wall 5 sec hold x 10    DATE: 07/09/2022 Manual Therapy: Manual therapy for skilled palpation and active tissue compression   Trigger Point Dry-Needling  Treatment instructions: Expect mild to moderate muscle soreness. Patient Consent Given: Yes Education handout provided: Previously Muscles treated: Lt mid thoracic paraspinals/multifidi, Lt infraspinatus Treatment response/outcome: good overall tolerance,twitch response noted    Therapeutic Exercise: Standing green tband rows 2 x 15 Standing green tband gh ext 2 x 15 UBE Lvl 3 min fwd/back  Seated cervical retraction into ball at wall 5 sec hold x 10    Reviewed y's and t's from last visit.  Education on sitting posture  c alignment cues for work.       PATIENT EDUCATION:  Education details: Pt education on PT impairments, prognosis, and POC. Informed consent. Rationale for interventions, safe/appropriate HEP performance Person educated: Patient Education method: Explanation, Demonstration, Tactile cues, Verbal cues, and Handouts Education comprehension: verbalized understanding, returned demonstration, verbal cues required, tactile cues required, and needs further education    HOME EXERCISE PROGRAM: Access Code: [email or web address] URL: [email or web address] Date: 07/02/2022 Prepared by: Scot Jun  Exercises - Seated Shoulder Press Ups Off Table  - 1-2 x daily - 7 x weekly - 3 sets - 10 reps - Shoulder External Rotation and Scapular Retraction with Resistance  - 1-2 x daily - 7 x weekly -  2-3 sets - 10 reps - Prone Scapular Retraction  - 2 x daily - 7 x weekly - 1 sets - 10 reps - 5 hold - Prone Scapular Slide with Shoulder Extension  - 2 x daily - 7 x weekly - 1 sets - 10 reps - 5 hold  ASSESSMENT:  CLINICAL IMPRESSION:  Deferred manual and needling option today due to improvements.  Spent today c focus on strengthening intervention that could be replicated at home in addition to use of machine weight (not available at home)   OBJECTIVE IMPAIRMENTS: decreased activity tolerance, decreased mobility, decreased strength, increased muscle spasms, impaired UE functional use, postural dysfunction, and pain.   ACTIVITY LIMITATIONS: carrying, lifting, bending, sitting, reach over head, and locomotion level  PARTICIPATION LIMITATIONS: community activity, occupation, and recreational exercise  PERSONAL FACTORS: Time since onset of injury/illness/exacerbation and 1 comorbidity: anxiety/depression  are also affecting patient's functional outcome.   REHAB POTENTIAL: Good  CLINICAL DECISION MAKING: Stable/uncomplicated  EVALUATION COMPLEXITY: Low   GOALS: Goals reviewed with patient?  No  SHORT TERM GOALS: Target date: 07/16/2022 Pt will demonstrate appropriate understanding and performance of initially prescribed HEP in order to facilitate improved independence with management of symptoms.  Baseline: HEP provided on eval Goal status: Met 07/15/2022   LONG TERM GOALS: Target date: 08/06/2022 Pt will score 80% on FOTO in order to demonstrate improved perception of functional status due to symptoms.  Baseline: 74% Goal status: on going 07/15/2022  2.  Pt will demonstrate full thoracolumbar rotation AROM without pain in order to demonstrate improved tolerance to functional movement patterns.  Baseline: see ROM chart above Goal status: on going 07/15/2022  3.  Pt will demonstrate grossly symmetrical in order to demonstrate improved strength for functional movements.  Baseline: see MMT chart above Goal status: on going 07/15/2022  4. Pt will report ability to sit for up to 1 hour with less than 2/10 pain on NPS for improved tolerance to daily activities  Baseline: up to 8/10 pain with sitting for an hour  Goal status: on going 07/15/2022   5. Pt will be able to run for up to 3 miles with less than 3/10 thoracic pain on NPS in order to return to PLOF and maximize health/QOL.    Baseline: up to 8/10 pain w/ running 3 miles   Goal status: on going 07/15/2022  PLAN:  PT FREQUENCY: 2x/week  PT DURATION: 6 weeks  PLANNED INTERVENTIONS: Therapeutic exercises, Therapeutic activity, Neuromuscular re-education, Balance training, Gait training, Patient/Family education, Self Care, Joint mobilization, Dry Needling, Electrical stimulation, Spinal mobilization, Cryotherapy, Moist heat, Taping, Manual therapy, and Re-evaluation. Above unless contraindicated.   PLAN FOR NEXT SESSION: DN as desired, progressive postural strengthening.   Scot Jun, PT, DPT, OCS, ATC 07/15/22  1:38 PM

## 2022-07-20 ENCOUNTER — Encounter: Payer: Self-pay | Admitting: Physical Therapy

## 2022-07-20 ENCOUNTER — Ambulatory Visit: Payer: BC Managed Care – PPO | Admitting: Physical Therapy

## 2022-07-20 DIAGNOSIS — R293 Abnormal posture: Secondary | ICD-10-CM | POA: Diagnosis not present

## 2022-07-20 DIAGNOSIS — M546 Pain in thoracic spine: Secondary | ICD-10-CM | POA: Diagnosis not present

## 2022-07-20 DIAGNOSIS — M6281 Muscle weakness (generalized): Secondary | ICD-10-CM

## 2022-07-20 NOTE — Therapy (Unsigned)
OUTPATIENT PHYSICAL THERAPY TREATMENT   Patient Name: Shelley Armstrong MRN: [id number] DOB:1971/03/30, 52 y.o., female Today's Date: 07/20/2022  END OF SESSION:  PT End of Session - 07/20/22 1607     Visit Number 7    Number of Visits 13    Date for PT Re-Evaluation 08/20/22    Authorization Type BCBS    PT Start Time 1600    PT Stop Time 1640    PT Time Calculation (min) 40 min    Activity Tolerance Patient tolerated treatment well    Behavior During Therapy [facility] for tasks assessed/performed                  Past Medical History:  Diagnosis Date   Hypercholesterolemia    Past Surgical History:  Procedure Laterality Date   ROBOTIC ASSISTED LAP VAGINAL HYSTERECTOMY  11/2015   has ovaries   Patient Active Problem List   Diagnosis Date Noted   Greater trochanteric pain syndrome of left lower extremity 09/23/2021   Labral tear of hip, degenerative 09/15/2021   Patellofemoral pain syndrome of both knees 05/13/2021   Scapular dysfunction 08/13/2020   Right foot pain 06/11/2019   Back pain 04/21/2017   Low back pain radiating to right leg 02/15/2017   Hereditary and idiopathic peripheral neuropathy 04/19/2016   Long term use of drug 08/13/2014   Major depression, recurrent ([place]) 10/24/2013   Generalized anxiety disorder 02/27/2013   Hyperlipidemia 12/02/2012    PCP: Glendon Axe, MD  REFERRING PROVIDER: Lorine Bears, NP  REFERRING DIAG: M79.18 (ICD-10-CM) - Myofascial pain syndrome M54.9 (ICD-10-CM) - Upper back pain M54.6,G89.29 (ICD-10-CM) - Chronic bilateral thoracic back pain  Rationale for Evaluation and Treatment: Rehabilitation  THERAPY DIAG:  Pain in thoracic spine  Abnormal posture  Muscle weakness (generalized)  ONSET DATE: a couple months ago, sudden onset   SUBJECTIVE:                                                                                                                                                                                            SUBJECTIVE STATEMENT: She indicated having some soreness after doing other workouts and ran 5k this weekend.   PERTINENT HISTORY:  anxiety/depression, multiple MSK issues in past   PAIN:  Are you having pain: at worst 6/10 Location: Lt middle  How would you describe your pain? Aching pain typically, occasional sharp pain Aggravating factors: cold, prolonged sitting Easing factors: heating pad, medication, repositioning   PRECAUTIONS: None  WEIGHT BEARING RESTRICTIONS: No  FALLS:  Has patient fallen in last 6 months? No  LIVING ENVIRONMENT: With spouse and cat, 1 story,  no STE  OCCUPATION: works at [employer], has to lift heavy boxes of books  PLOF: Independent  PATIENT GOALS: reduce pain  OBJECTIVE:   DIAGNOSTIC FINDINGS:  06/25/2022 No recent imaging   PATIENT SURVEYS:   06/25/2022 FOTO 74% predicted 80%  SCREENING FOR RED FLAGS: 06/25/2022 Unremarkable for red flags  COGNITION: 06/25/2022 Overall cognitive status: Within functional limits for tasks assessed     SENSATION: 06/25/2022 Light touch intact all extremities   POSTURE:  06/25/2022 rounded shoulders, forward head, and increased thoracic kyphosis  PALPATION: 07/09/2022:  Trigger points c concordant symptoms in Lt mid thoracic paraspinals, Lt infraspinatus.   06/25/2022 TTP B middle/lower traps, rhomboids, lats. R infraspinatus/teres. Trigger points throughout Mild stiffness with grade 2-3 PA mobs throughout lower T spine, no overt pain, pt reports mild relief  Thoracic ROM:   Active  AROM  eval  Flexion   Extension   Right lateral flexion   Left lateral flexion   Right rotation   Left rotation    (Blank rows = not tested)  Cervical ROM grossly WNL all planes, painless   LUMBAR ROM:   AROM 06/25/2022  Flexion 100% painless (distal shin)  Extension 100%  Right lateral flexion   Left lateral flexion   Right rotation 100% *  Left rotation 100% *    (Blank rows =  not tested) Comments: increased stiffness and reduction in rotation B w/ hips fixed compared to free hips, no change in pain  UPPER EXTREMITY ROM:  A/PROM Right eval Left eval  Shoulder flexion    Shoulder abduction    Shoulder internal rotation    Shoulder external rotation    Elbow flexion    Elbow extension    Wrist flexion    Wrist extension     (Blank rows = not tested) (Key: WFL = within functional limits not formally assessed, * = concordant pain, s = stiffness/stretching sensation, NT = not tested)  Comments:  06/25/2022 GH ROM grossly WNL flexion and abduction, painless   UPPER EXTREMITY MMT:  MMT Right 06/25/2022 Left 06/25/2022 Right 07/15/2022 Left 07/15/2022  Shoulder flexion 5 5 5/5 5/5  Shoulder extension      Shoulder abduction 5 5 5/5 5/55  Shoulder extension      Shoulder internal rotation 5 5 5/5 5/5  Shoulder external rotation 4* 4+ 5/5 5/5  Elbow flexion      Elbow extension      Grip strength      (Blank rows = not tested)  (Key: WFL = within functional limits not formally assessed, * = concordant pain, s = stiffness/stretching sensation, NT = not tested)  Comments: of note, with HEP pt demonstrates notably reduced control of periscapular musculature with frequent compensations at thoracolumbar spine   GAIT: 06/25/2022 Distance walked: within clinic Assistive device utilized: None Level of assistance: Complete Independence Comments: WNL   TODAY'S TREATMENT:  DATE: 07/20/2022 Therapeutic Exercise: UBE Lvl 3 3  min fwd/back each way with faster interval end of each minute :50-:60 self selected Seated row machine #20 2x15  (seated upper trap self stretch after sets 15 sec x 3 bilateral) Seated lat machine #20  2 x15 Standing blue band GH ext past legs 2-3 sec hold 2 x 10  Standing P ball roll up wall into bilat shoulder flexion and thoracic  extension stretch 5 sec X 10 Standing bilat ER green 2 x 15  Standing horizontal abduction green 2X10 Counter top full plank position/push up position, moving into alternating shoulder flexion X 5 bilat  Manual Therapy: Manual therapy for skilled palpation and active tissue compression   Trigger Point Dry-Needling  Treatment instructions: Expect mild to moderate muscle soreness. Patient Consent Given: Yes Education handout provided: Previously Muscles treated:  bilat  infraspinatus, bilat thoracic paraspinals and multifidi Treatment response/outcome: good overall tolerance,twitch response noted    TODAY'S TREATMENT:                                                                                                           DATE: 07/15/2022 Therapeutic Exercise: UBE Lvl 3 4  min fwd/back each way with faster interval end of each minute :50-:60 self selected Seated row machine #20 2x15  (seated upper trap self stretch after sets 15 sec x 3 bilateral) Seated lat machine #20  2 x10 Standing blue band GH ext past legs 2-3 sec hold 2 x 10  Serratus anterior forearm wall slide into flexion on foam roller x 10, on small pball x 10  Standing bilat ER green 2 x 15  Supine UC Flexion hold 5 sec x 10 Supine scapular retraction into table 5 sec hold x 10  Supine bilateral UE extension into table 5 sec hold x 10       PATIENT EDUCATION:  Education details: Pt education on PT impairments, prognosis, and POC. Informed consent. Rationale for interventions, safe/appropriate HEP performance Person educated: Patient Education method: Explanation, Demonstration, Tactile cues, Verbal cues, and Handouts Education comprehension: verbalized understanding, returned demonstration, verbal cues required, tactile cues required, and needs further education    HOME EXERCISE PROGRAM: Access Code: [email or web address] URL: [email or web address] Date: 07/02/2022 Prepared by: Chyrel Masson  Exercises - Seated  Shoulder Press Ups Off Table  - 1-2 x daily - 7 x weekly - 3 sets - 10 reps - Shoulder External Rotation and Scapular Retraction with Resistance  - 1-2 x daily - 7 x weekly - 2-3 sets - 10 reps - Prone Scapular Retraction  - 2 x daily - 7 x weekly - 1 sets - 10 reps - 5 hold - Prone Scapular Slide with Shoulder Extension  - 2 x daily - 7 x weekly - 1 sets - 10 reps - 5 hold  ASSESSMENT:  CLINICAL IMPRESSION:  Performed manual and needling at her request today after session which focused on strengthening program with good tolerance to this but fatigue noted. PT recommending to continue with POC.  OBJECTIVE IMPAIRMENTS: decreased activity tolerance,  decreased mobility, decreased strength, increased muscle spasms, impaired UE functional use, postural dysfunction, and pain.   ACTIVITY LIMITATIONS: carrying, lifting, bending, sitting, reach over head, and locomotion level  PARTICIPATION LIMITATIONS: community activity, occupation, and recreational exercise  PERSONAL FACTORS: Time since onset of injury/illness/exacerbation and 1 comorbidity: anxiety/depression  are also affecting patient's functional outcome.   REHAB POTENTIAL: Good  CLINICAL DECISION MAKING: Stable/uncomplicated  EVALUATION COMPLEXITY: Low   GOALS: Goals reviewed with patient? No  SHORT TERM GOALS: Target date: 07/16/2022 Pt will demonstrate appropriate understanding and performance of initially prescribed HEP in order to facilitate improved independence with management of symptoms.  Baseline: HEP provided on eval Goal status: Met 07/15/2022   LONG TERM GOALS: Target date: 08/06/2022 Pt will score 80% on FOTO in order to demonstrate improved perception of functional status due to symptoms.  Baseline: 74% Goal status: on going 07/15/2022  2.  Pt will demonstrate full thoracolumbar rotation AROM without pain in order to demonstrate improved tolerance to functional movement patterns.  Baseline: see ROM chart above Goal  status: on going 07/15/2022  3.  Pt will demonstrate grossly symmetrical in order to demonstrate improved strength for functional movements.  Baseline: see MMT chart above Goal status: on going 07/15/2022  4. Pt will report ability to sit for up to 1 hour with less than 2/10 pain on NPS for improved tolerance to daily activities  Baseline: up to 8/10 pain with sitting for an hour  Goal status: on going 07/15/2022   5. Pt will be able to run for up to 3 miles with less than 3/10 thoracic pain on NPS in order to return to PLOF and maximize health/QOL.    Baseline: up to 8/10 pain w/ running 3 miles   Goal status: on going 07/15/2022  PLAN:  PT FREQUENCY: 2x/week  PT DURATION: 6 weeks  PLANNED INTERVENTIONS: Therapeutic exercises, Therapeutic activity, Neuromuscular re-education, Balance training, Gait training, Patient/Family education, Self Care, Joint mobilization, Dry Needling, Electrical stimulation, Spinal mobilization, Cryotherapy, Moist heat, Taping, Manual therapy, and Re-evaluation. Above unless contraindicated.   PLAN FOR NEXT SESSION: DN as desired, progressive postural strengthening.   Elsie Ra, PT, DPT 07/20/22 4:44 PM

## 2022-07-20 NOTE — Therapy (Incomplete)
OUTPATIENT PHYSICAL THERAPY TREATMENT   Patient Name: Shelley Armstrong MRN: [id number] DOB:23-May-1971, 52 y.o., female Today's Date: 07/20/2022  END OF SESSION:        Past Medical History:  Diagnosis Date   Hypercholesterolemia    Past Surgical History:  Procedure Laterality Date   ROBOTIC ASSISTED LAP VAGINAL HYSTERECTOMY  11/2015   has ovaries   Patient Active Problem List   Diagnosis Date Noted   Greater trochanteric pain syndrome of left lower extremity 09/23/2021   Labral tear of hip, degenerative 09/15/2021   Patellofemoral pain syndrome of both knees 05/13/2021   Scapular dysfunction 08/13/2020   Right foot pain 06/11/2019   Back pain 04/21/2017   Low back pain radiating to right leg 02/15/2017   Hereditary and idiopathic peripheral neuropathy 04/19/2016   Long term use of drug 08/13/2014   Major depression, recurrent ([place]) 10/24/2013   Generalized anxiety disorder 02/27/2013   Hyperlipidemia 12/02/2012    PCP: Glendon Axe, MD  REFERRING PROVIDER: Lorine Bears, NP  REFERRING DIAG: M79.18 (ICD-10-CM) - Myofascial pain syndrome M54.9 (ICD-10-CM) - Upper back pain M54.6,G89.29 (ICD-10-CM) - Chronic bilateral thoracic back pain  Rationale for Evaluation and Treatment: Rehabilitation  THERAPY DIAG:  No diagnosis found.  ONSET DATE: a couple months ago, sudden onset   SUBJECTIVE:                                                                                                                                                                                           SUBJECTIVE STATEMENT: She indicated doing fairly well since last visit.  No real pain increases reported.  Feeling like she had gotten some improvement.   PERTINENT HISTORY:  anxiety/depression, multiple MSK issues in past   PAIN:  Are you having pain: at worst 4/10 Location: Lt middle  How would you describe your pain? Aching pain typically, occasional sharp pain Aggravating factors: cold,  prolonged sitting Easing factors: heating pad, medication, repositioning   PRECAUTIONS: None  WEIGHT BEARING RESTRICTIONS: No  FALLS:  Has patient fallen in last 6 months? No  LIVING ENVIRONMENT: With spouse and cat, 1 story, no STE  OCCUPATION: works at [employer], has to lift heavy boxes of books  PLOF: Independent  PATIENT GOALS: reduce pain  OBJECTIVE:   DIAGNOSTIC FINDINGS:  06/25/2022 No recent imaging   PATIENT SURVEYS:   06/25/2022 FOTO 74% predicted 80%  SCREENING FOR RED FLAGS: 06/25/2022 Unremarkable for red flags  COGNITION: 06/25/2022 Overall cognitive status: Within functional limits for tasks assessed     SENSATION: 06/25/2022 Light touch intact all extremities   POSTURE:  06/25/2022 rounded shoulders, forward head, and  increased thoracic kyphosis  PALPATION: 07/09/2022:  Trigger points c concordant symptoms in Lt mid thoracic paraspinals, Lt infraspinatus.   06/25/2022 TTP B middle/lower traps, rhomboids, lats. R infraspinatus/teres. Trigger points throughout Mild stiffness with grade 2-3 PA mobs throughout lower T spine, no overt pain, pt reports mild relief  Thoracic ROM:   Active  AROM  eval  Flexion   Extension   Right lateral flexion   Left lateral flexion   Right rotation   Left rotation    (Blank rows = not tested)  Cervical ROM grossly WNL all planes, painless   LUMBAR ROM:   AROM 06/25/2022  Flexion 100% painless (distal shin)  Extension 100%  Right lateral flexion   Left lateral flexion   Right rotation 100% *  Left rotation 100% *    (Blank rows = not tested) Comments: increased stiffness and reduction in rotation B w/ hips fixed compared to free hips, no change in pain  UPPER EXTREMITY ROM:  A/PROM Right eval Left eval  Shoulder flexion    Shoulder abduction    Shoulder internal rotation    Shoulder external rotation    Elbow flexion    Elbow extension    Wrist flexion    Wrist extension      (Blank rows = not tested) (Key: WFL = within functional limits not formally assessed, * = concordant pain, s = stiffness/stretching sensation, NT = not tested)  Comments:  06/25/2022 GH ROM grossly WNL flexion and abduction, painless   UPPER EXTREMITY MMT:  MMT Right 06/25/2022 Left 06/25/2022 Right 07/15/2022 Left 07/15/2022  Shoulder flexion 5 5 5/5 5/5  Shoulder extension      Shoulder abduction 5 5 5/5 5/55  Shoulder extension      Shoulder internal rotation 5 5 5/5 5/5  Shoulder external rotation 4* 4+ 5/5 5/5  Elbow flexion      Elbow extension      Grip strength      (Blank rows = not tested)  (Key: WFL = within functional limits not formally assessed, * = concordant pain, s = stiffness/stretching sensation, NT = not tested)  Comments: of note, with HEP pt demonstrates notably reduced control of periscapular musculature with frequent compensations at thoracolumbar spine   GAIT: 06/25/2022 Distance walked: within clinic Assistive device utilized: None Level of assistance: Complete Independence Comments: WNL   TODAY'S TREATMENT:                                                                                                           DATE: 07/20/2022 Therapeutic Exercise: UBE Lvl 3 4  min fwd/back each way with faster interval end of each minute :50-:60 self selected Seated row machine #20 2x15  (seated upper trap self stretch after sets 15 sec x 3 bilateral) Seated lat machine #20  2 x10 Standing blue band GH ext past legs 2-3 sec hold 2 x 10  Serratus anterior forearm wall slide into flexion on foam roller x 10, on small pball x 10  Standing bilat ER green 2 x  15  Supine UC Flexion hold 5 sec x 10 Supine scapular retraction into table 5 sec hold x 10  Supine bilateral UE extension into table 5 sec hold x 10   TODAY'S TREATMENT:                                                                                                           DATE: 07/15/2022 Therapeutic  Exercise: UBE Lvl 3 4  min fwd/back each way with faster interval end of each minute :50-:60 self selected Seated row machine #20 2x15  (seated upper trap self stretch after sets 15 sec x 3 bilateral) Seated lat machine #20  2 x10 Standing blue band GH ext past legs 2-3 sec hold 2 x 10  Serratus anterior forearm wall slide into flexion on foam roller x 10, on small pball x 10  Standing bilat ER green 2 x 15  Supine UC Flexion hold 5 sec x 10 Supine scapular retraction into table 5 sec hold x 10  Supine bilateral UE extension into table 5 sec hold x 10     TODAY'S TREATMENT:                                                                                                           DATE: 07/13/2022 Manual Therapy: Manual therapy for skilled palpation and active tissue compression   Trigger Point Dry-Needling  Treatment instructions: Expect mild to moderate muscle soreness. Patient Consent Given: Yes Education handout provided: Previously Muscles treated:  Lt infraspinatus Treatment response/outcome: good overall tolerance,twitch response noted   Therapeutic Exercise: UBE Lvl 3 min fwd/back  Seated row machine #20 2x10 Seated lat machine #20 first set wide grip x10, second set close x10 Standing cable machine 5# each side, gh ext one arm at a time  x 15 each arm Standing horizontal abduction green 2X10 bilat Standing bilat ER green 2X10  Standing wall slides then moving into thoracic extension/shoulder hyperflexion X 10 bilat Low to high row 10 #KB X 10 bilat with one UE support Seated cervical retraction into ball at wall 5 sec hold x 10     PATIENT EDUCATION:  Education details: Pt education on PT impairments, prognosis, and POC. Informed consent. Rationale for interventions, safe/appropriate HEP performance Person educated: Patient Education method: Explanation, Demonstration, Tactile cues, Verbal cues, and Handouts Education comprehension: verbalized understanding, returned  demonstration, verbal cues required, tactile cues required, and needs further education    HOME EXERCISE PROGRAM: Access Code: [email or web address] URL: [email or web address] Date: 07/02/2022 Prepared by: Chyrel Masson  Exercises - Seated Shoulder Press Ups Off Table  - 1-2 x  daily - 7 x weekly - 3 sets - 10 reps - Shoulder External Rotation and Scapular Retraction with Resistance  - 1-2 x daily - 7 x weekly - 2-3 sets - 10 reps - Prone Scapular Retraction  - 2 x daily - 7 x weekly - 1 sets - 10 reps - 5 hold - Prone Scapular Slide with Shoulder Extension  - 2 x daily - 7 x weekly - 1 sets - 10 reps - 5 hold  ASSESSMENT:  CLINICAL IMPRESSION:  Deferred manual and needling option today due to improvements.  Spent today c focus on strengthening intervention that could be replicated at home in addition to use of machine weight (not available at home)   OBJECTIVE IMPAIRMENTS: decreased activity tolerance, decreased mobility, decreased strength, increased muscle spasms, impaired UE functional use, postural dysfunction, and pain.   ACTIVITY LIMITATIONS: carrying, lifting, bending, sitting, reach over head, and locomotion level  PARTICIPATION LIMITATIONS: community activity, occupation, and recreational exercise  PERSONAL FACTORS: Time since onset of injury/illness/exacerbation and 1 comorbidity: anxiety/depression  are also affecting patient's functional outcome.   REHAB POTENTIAL: Good  CLINICAL DECISION MAKING: Stable/uncomplicated  EVALUATION COMPLEXITY: Low   GOALS: Goals reviewed with patient? No  SHORT TERM GOALS: Target date: 07/16/2022 Pt will demonstrate appropriate understanding and performance of initially prescribed HEP in order to facilitate improved independence with management of symptoms.  Baseline: HEP provided on eval Goal status: Met 07/15/2022   LONG TERM GOALS: Target date: 08/06/2022 Pt will score 80% on FOTO in order to demonstrate improved perception of  functional status due to symptoms.  Baseline: 74% Goal status: on going 07/15/2022  2.  Pt will demonstrate full thoracolumbar rotation AROM without pain in order to demonstrate improved tolerance to functional movement patterns.  Baseline: see ROM chart above Goal status: on going 07/15/2022  3.  Pt will demonstrate grossly symmetrical in order to demonstrate improved strength for functional movements.  Baseline: see MMT chart above Goal status: on going 07/15/2022  4. Pt will report ability to sit for up to 1 hour with less than 2/10 pain on NPS for improved tolerance to daily activities  Baseline: up to 8/10 pain with sitting for an hour  Goal status: on going 07/15/2022   5. Pt will be able to run for up to 3 miles with less than 3/10 thoracic pain on NPS in order to return to PLOF and maximize health/QOL.    Baseline: up to 8/10 pain w/ running 3 miles   Goal status: on going 07/15/2022  PLAN:  PT FREQUENCY: 2x/week  PT DURATION: 6 weeks  PLANNED INTERVENTIONS: Therapeutic exercises, Therapeutic activity, Neuromuscular re-education, Balance training, Gait training, Patient/Family education, Self Care, Joint mobilization, Dry Needling, Electrical stimulation, Spinal mobilization, Cryotherapy, Moist heat, Taping, Manual therapy, and Re-evaluation. Above unless contraindicated.   PLAN FOR NEXT SESSION: DN as desired, progressive postural strengthening.   Chyrel Masson, PT, DPT, OCS, ATC 07/20/22  12:10 PM

## 2022-07-23 ENCOUNTER — Encounter: Payer: BC Managed Care – PPO | Admitting: Rehabilitative and Restorative Service Providers"

## 2022-07-26 DIAGNOSIS — I10 Essential (primary) hypertension: Secondary | ICD-10-CM | POA: Diagnosis not present

## 2022-07-26 DIAGNOSIS — E7800 Pure hypercholesterolemia, unspecified: Secondary | ICD-10-CM | POA: Diagnosis not present

## 2022-07-26 DIAGNOSIS — R635 Abnormal weight gain: Secondary | ICD-10-CM | POA: Diagnosis not present

## 2022-07-26 DIAGNOSIS — R7303 Prediabetes: Secondary | ICD-10-CM | POA: Diagnosis not present

## 2022-07-26 DIAGNOSIS — Z6826 Body mass index (BMI) 26.0-26.9, adult: Secondary | ICD-10-CM | POA: Diagnosis not present

## 2022-07-29 ENCOUNTER — Encounter: Payer: Self-pay | Admitting: Physical Therapy

## 2022-07-29 ENCOUNTER — Ambulatory Visit: Payer: BC Managed Care – PPO | Admitting: Physical Therapy

## 2022-07-29 DIAGNOSIS — M6281 Muscle weakness (generalized): Secondary | ICD-10-CM

## 2022-07-29 DIAGNOSIS — M546 Pain in thoracic spine: Secondary | ICD-10-CM | POA: Diagnosis not present

## 2022-07-29 DIAGNOSIS — R293 Abnormal posture: Secondary | ICD-10-CM

## 2022-07-29 NOTE — Therapy (Signed)
OUTPATIENT PHYSICAL THERAPY TREATMENT   Patient Name: Shelley Armstrong MRN: [id number] DOB:09-Feb-1971, 52 y.o., female Today's Date: 07/29/2022  END OF SESSION:  PT End of Session - 07/29/22 1315     Visit Number 8    Number of Visits 13    Date for PT Re-Evaluation 08/20/22    Authorization Type [employer]    PT Start Time 1307    PT Stop Time 1345    PT Time Calculation (min) 38 min    Activity Tolerance Patient tolerated treatment well    Behavior During Therapy [facility] for tasks assessed/performed                  Past Medical History:  Diagnosis Date   Hypercholesterolemia    Past Surgical History:  Procedure Laterality Date   ROBOTIC ASSISTED LAP VAGINAL HYSTERECTOMY  11/2015   has ovaries   Patient Active Problem List   Diagnosis Date Noted   Greater trochanteric pain syndrome of left lower extremity 09/23/2021   Labral tear of hip, degenerative 09/15/2021   Patellofemoral pain syndrome of both knees 05/13/2021   Scapular dysfunction 08/13/2020   Right foot pain 06/11/2019   Back pain 04/21/2017   Low back pain radiating to right leg 02/15/2017   Hereditary and idiopathic peripheral neuropathy 04/19/2016   Long term use of drug 08/13/2014   Major depression, recurrent ([facility]) 10/24/2013   Generalized anxiety disorder 02/27/2013   Hyperlipidemia 12/02/2012    PCP: Caffie Damme, MD  REFERRING PROVIDER: Juanda Chance, NP  REFERRING DIAG: M79.18 (ICD-10-CM) - Myofascial pain syndrome M54.9 (ICD-10-CM) - Upper back pain M54.6,G89.29 (ICD-10-CM) - Chronic bilateral thoracic back pain  Rationale for Evaluation and Treatment: Rehabilitation  THERAPY DIAG:  Pain in thoracic spine  Abnormal posture  Muscle weakness (generalized)  ONSET DATE: a couple months ago, sudden onset   SUBJECTIVE:                                                                                                                                                                                            SUBJECTIVE STATEMENT: She states she is feeling better today with her pain.   PERTINENT HISTORY:  anxiety/depression, multiple MSK issues in past   PAIN:  Are you having pain: 2-3/10 Location: Lt middle  How would you describe your pain? Aching pain typically, occasional sharp pain Aggravating factors: cold, prolonged sitting Easing factors: heating pad, medication, repositioning   PRECAUTIONS: None  WEIGHT BEARING RESTRICTIONS: No  FALLS:  Has patient fallen in last 6 months? No  LIVING ENVIRONMENT: With spouse and cat, 1 story, no STE  OCCUPATION: works at  [employer], has to lift heavy boxes of books  PLOF: Independent  PATIENT GOALS: reduce pain  OBJECTIVE:   DIAGNOSTIC FINDINGS:  06/25/2022 No recent imaging   PATIENT SURVEYS:   06/25/2022 FOTO 74% predicted 80%  SCREENING FOR RED FLAGS: 06/25/2022 Unremarkable for red flags  COGNITION: 06/25/2022 Overall cognitive status: Within functional limits for tasks assessed     SENSATION: 06/25/2022 Light touch intact all extremities   POSTURE:  06/25/2022 rounded shoulders, forward head, and increased thoracic kyphosis  PALPATION: 07/09/2022:  Trigger points c concordant symptoms in Lt mid thoracic paraspinals, Lt infraspinatus.   06/25/2022 TTP B middle/lower traps, rhomboids, lats. R infraspinatus/teres. Trigger points throughout Mild stiffness with grade 2-3 PA mobs throughout lower T spine, no overt pain, pt reports mild relief  Thoracic ROM:   Active  AROM  eval  Flexion   Extension   Right lateral flexion   Left lateral flexion   Right rotation   Left rotation    (Blank rows = not tested)  Cervical ROM grossly WNL all planes, painless   LUMBAR ROM:   AROM 06/25/2022  Flexion 100% painless (distal shin)  Extension 100%  Right lateral flexion   Left lateral flexion   Right rotation 100% *  Left rotation 100% *    (Blank rows = not tested) Comments: increased  stiffness and reduction in rotation B w/ hips fixed compared to free hips, no change in pain  UPPER EXTREMITY ROM:  A/PROM Right eval Left eval  Shoulder flexion    Shoulder abduction    Shoulder internal rotation    Shoulder external rotation    Elbow flexion    Elbow extension    Wrist flexion    Wrist extension     (Blank rows = not tested) (Key: WFL = within functional limits not formally assessed, * = concordant pain, s = stiffness/stretching sensation, NT = not tested)  Comments:  06/25/2022 GH ROM grossly WNL flexion and abduction, painless   UPPER EXTREMITY MMT:  MMT Right 06/25/2022 Left 06/25/2022 Right 07/15/2022 Left 07/15/2022  Shoulder flexion 5 5 5/5 5/5  Shoulder extension      Shoulder abduction 5 5 5/5 5/55  Shoulder extension      Shoulder internal rotation 5 5 5/5 5/5  Shoulder external rotation 4* 4+ 5/5 5/5  Elbow flexion      Elbow extension      Grip strength      (Blank rows = not tested)  (Key: WFL = within functional limits not formally assessed, * = concordant pain, s = stiffness/stretching sensation, NT = not tested)  Comments: of note, with HEP pt demonstrates notably reduced control of periscapular musculature with frequent compensations at thoracolumbar spine   GAIT: 06/25/2022 Distance walked: within clinic Assistive device utilized: None Level of assistance: Complete Independence Comments: WNL   TODAY'S TREATMENT:                                                                                                            DATE: 07/29/2022 Therapeutic  Exercise: UBE Lvl 3, 3  min fwd/back each way  Seated row machine #20 2x15  (seated upper trap self stretch after sets 15 sec x 3 bilateral) Seated lat machine #20  2 x15 Standing blue band GH ext past legs 2-3 sec hold 2 x 10  Standing P ball roll up wall into bilat shoulder flexion and thoracic extension stretch 5 sec X 10 Standing bilat ER green 2 x 15  Standing horizontal abduction  green 2X10 Shoulder clock at wall with red band loop. X 3 each side Bent over Y,T,I with 2#, X 10 each bilat  DATE: 07/20/2022 Therapeutic Exercise: UBE Lvl 3 3  min fwd/back each way with faster interval end of each minute :50-:60 self selected Seated row machine #20 2x15  (seated upper trap self stretch after sets 15 sec x 3 bilateral) Seated lat machine #20  2 x15 Standing blue band GH ext past legs 2-3 sec hold 2 x 10  Standing P ball roll up wall into bilat shoulder flexion and thoracic extension stretch 5 sec X 10 Standing bilat ER green 2 x 15  Standing horizontal abduction green 2X10 Counter top full plank position/push up position, moving into alternating shoulder flexion X 5 bilat  Manual Therapy: Manual therapy for skilled palpation and active tissue compression   Trigger Point Dry-Needling  Treatment instructions: Expect mild to moderate muscle soreness. Patient Consent Given: Yes Education handout provided: Previously Muscles treated:  bilat  infraspinatus, bilat thoracic paraspinals and multifidi Treatment response/outcome: good overall tolerance,twitch response noted    TODAY'S TREATMENT:                                                                                                           DATE: 07/15/2022 Therapeutic Exercise: UBE Lvl 3 4  min fwd/back each way with faster interval end of each minute :50-:60 self selected Seated row machine #20 2x15  (seated upper trap self stretch after sets 15 sec x 3 bilateral) Seated lat machine #20  2 x10 Standing blue band GH ext past legs 2-3 sec hold 2 x 10  Serratus anterior forearm wall slide into flexion on foam roller x 10, on small pball x 10  Standing bilat ER green 2 x 15  Supine UC Flexion hold 5 sec x 10 Supine scapular retraction into table 5 sec hold x 10  Supine bilateral UE extension into table 5 sec hold x 10       PATIENT EDUCATION:  Education details: Pt education on PT impairments, prognosis, and POC.  Informed consent. Rationale for interventions, safe/appropriate HEP performance Person educated: Patient Education method: Explanation, Demonstration, Tactile cues, Verbal cues, and Handouts Education comprehension: verbalized understanding, returned demonstration, verbal cues required, tactile cues required, and needs further education    HOME EXERCISE PROGRAM: Access Code: [email or web address] URL: [email or web address] Date: 07/02/2022 Prepared by: Scot Jun  Exercises - Seated Shoulder Press Ups Off Table  - 1-2 x daily - 7 x weekly - 3 sets - 10 reps - Shoulder External Rotation and Scapular Retraction with Resistance  -  1-2 x daily - 7 x weekly - 2-3 sets - 10 reps - Prone Scapular Retraction  - 2 x daily - 7 x weekly - 1 sets - 10 reps - 5 hold - Prone Scapular Slide with Shoulder Extension  - 2 x daily - 7 x weekly - 1 sets - 10 reps - 5 hold  ASSESSMENT:  CLINICAL IMPRESSION:  She is progressing well with her scapular and postural strengthening program. She was feeling better so she deferred DN today and we will see how she does without it.   OBJECTIVE IMPAIRMENTS: decreased activity tolerance, decreased mobility, decreased strength, increased muscle spasms, impaired UE functional use, postural dysfunction, and pain.   ACTIVITY LIMITATIONS: carrying, lifting, bending, sitting, reach over head, and locomotion level  PARTICIPATION LIMITATIONS: community activity, occupation, and recreational exercise  PERSONAL FACTORS: Time since onset of injury/illness/exacerbation and 1 comorbidity: anxiety/depression  are also affecting patient's functional outcome.   REHAB POTENTIAL: Good  CLINICAL DECISION MAKING: Stable/uncomplicated  EVALUATION COMPLEXITY: Low   GOALS: Goals reviewed with patient? No  SHORT TERM GOALS: Target date: 07/16/2022 Pt will demonstrate appropriate understanding and performance of initially prescribed HEP in order to facilitate improved  independence with management of symptoms.  Baseline: HEP provided on eval Goal status: Met 07/15/2022   LONG TERM GOALS: Target date: 08/06/2022 Pt will score 80% on FOTO in order to demonstrate improved perception of functional status due to symptoms.  Baseline: 74% Goal status: on going 07/15/2022  2.  Pt will demonstrate full thoracolumbar rotation AROM without pain in order to demonstrate improved tolerance to functional movement patterns.  Baseline: see ROM chart above Goal status: on going 07/15/2022  3.  Pt will demonstrate grossly symmetrical in order to demonstrate improved strength for functional movements.  Baseline: see MMT chart above Goal status: on going 07/15/2022  4. Pt will report ability to sit for up to 1 hour with less than 2/10 pain on NPS for improved tolerance to daily activities  Baseline: up to 8/10 pain with sitting for an hour  Goal status: on going 07/15/2022   5. Pt will be able to run for up to 3 miles with less than 3/10 thoracic pain on NPS in order to return to PLOF and maximize health/QOL.    Baseline: up to 8/10 pain w/ running 3 miles   Goal status: on going 07/15/2022  PLAN:  PT FREQUENCY: 2x/week  PT DURATION: 6 weeks  PLANNED INTERVENTIONS: Therapeutic exercises, Therapeutic activity, Neuromuscular re-education, Balance training, Gait training, Patient/Family education, Self Care, Joint mobilization, Dry Needling, Electrical stimulation, Spinal mobilization, Cryotherapy, Moist heat, Taping, Manual therapy, and Re-evaluation. Above unless contraindicated.   PLAN FOR NEXT SESSION:, progressive postural strengthening.   Elsie Ra, PT, DPT 07/29/22 1:16 PM

## 2022-08-02 ENCOUNTER — Encounter: Payer: Self-pay | Admitting: Physical Therapy

## 2022-08-02 ENCOUNTER — Ambulatory Visit: Payer: BC Managed Care – PPO | Admitting: Physical Therapy

## 2022-08-02 ENCOUNTER — Telehealth: Payer: Self-pay | Admitting: Physical Medicine and Rehabilitation

## 2022-08-02 DIAGNOSIS — M546 Pain in thoracic spine: Secondary | ICD-10-CM

## 2022-08-02 DIAGNOSIS — R293 Abnormal posture: Secondary | ICD-10-CM | POA: Diagnosis not present

## 2022-08-02 DIAGNOSIS — M6281 Muscle weakness (generalized): Secondary | ICD-10-CM | POA: Diagnosis not present

## 2022-08-02 NOTE — Therapy (Signed)
OUTPATIENT PHYSICAL THERAPY TREATMENT   Patient Name: Shelley Armstrong MRN: [id number] DOB:1971-02-03, 52 y.o., female Today's Date: 08/02/2022  END OF SESSION:  PT End of Session - 08/02/22 1440     Visit Number 9    Number of Visits 13    Date for PT Re-Evaluation 08/20/22    Authorization Type BCBS    PT Start Time 1430    PT Stop Time 1510    PT Time Calculation (min) 40 min    Activity Tolerance Patient tolerated treatment well    Behavior During Therapy [facility] for tasks assessed/performed                  Past Medical History:  Diagnosis Date   Hypercholesterolemia    Past Surgical History:  Procedure Laterality Date   ROBOTIC ASSISTED LAP VAGINAL HYSTERECTOMY  11/2015   has ovaries   Patient Active Problem List   Diagnosis Date Noted   Greater trochanteric pain syndrome of left lower extremity 09/23/2021   Labral tear of hip, degenerative 09/15/2021   Patellofemoral pain syndrome of both knees 05/13/2021   Scapular dysfunction 08/13/2020   Right foot pain 06/11/2019   Back pain 04/21/2017   Low back pain radiating to right leg 02/15/2017   Hereditary and idiopathic peripheral neuropathy 04/19/2016   Long term use of drug 08/13/2014   Major depression, recurrent ([place]) 10/24/2013   Generalized anxiety disorder 02/27/2013   Hyperlipidemia 12/02/2012    PCP: Glendon Axe, MD  REFERRING PROVIDER: Lorine Bears, NP  REFERRING DIAG: M79.18 (ICD-10-CM) - Myofascial pain syndrome M54.9 (ICD-10-CM) - Upper back pain M54.6,G89.29 (ICD-10-CM) - Chronic bilateral thoracic back pain  Rationale for Evaluation and Treatment: Rehabilitation  THERAPY DIAG:  Pain in thoracic spine  Abnormal posture  Muscle weakness (generalized)  ONSET DATE: a couple months ago, sudden onset   SUBJECTIVE:                                                                                                                                                                                            SUBJECTIVE STATEMENT: She states she has about 4/10 pain, she is interested in seeing the MD about injections as she still gets some burning pain when she runs.  PERTINENT HISTORY:  anxiety/depression, multiple MSK issues in past   PAIN:  Are you having pain: 4/10 Location: Lt middle  How would you describe your pain? Aching pain typically, occasional sharp pain Aggravating factors: cold, prolonged sitting Easing factors: heating pad, medication, repositioning   PRECAUTIONS: None  WEIGHT BEARING RESTRICTIONS: No  FALLS:  Has patient fallen in last 6 months? No  LIVING ENVIRONMENT: With spouse and cat, 1 story, no STE  OCCUPATION: works at [employer], has to lift heavy boxes of books  PLOF: Independent  PATIENT GOALS: reduce pain  OBJECTIVE:   DIAGNOSTIC FINDINGS:  06/25/2022 No recent imaging   PATIENT SURVEYS:   06/25/2022 FOTO 74% predicted 80%  SCREENING FOR RED FLAGS: 06/25/2022 Unremarkable for red flags  COGNITION: 06/25/2022 Overall cognitive status: Within functional limits for tasks assessed     SENSATION: 06/25/2022 Light touch intact all extremities   POSTURE:  06/25/2022 rounded shoulders, forward head, and increased thoracic kyphosis  PALPATION: 07/09/2022:  Trigger points c concordant symptoms in Lt mid thoracic paraspinals, Lt infraspinatus.   06/25/2022 TTP B middle/lower traps, rhomboids, lats. R infraspinatus/teres. Trigger points throughout Mild stiffness with grade 2-3 PA mobs throughout lower T spine, no overt pain, pt reports mild relief  Thoracic ROM:   Active  AROM  eval  Flexion   Extension   Right lateral flexion   Left lateral flexion   Right rotation   Left rotation    (Blank rows = not tested)  Cervical ROM grossly WNL all planes, painless   LUMBAR ROM:   AROM 06/25/2022  Flexion 100% painless (distal shin)  Extension 100%  Right lateral flexion   Left lateral flexion   Right rotation 100% *   Left rotation 100% *    (Blank rows = not tested) Comments: increased stiffness and reduction in rotation B w/ hips fixed compared to free hips, no change in pain  UPPER EXTREMITY ROM:  A/PROM Right eval Left eval  Shoulder flexion    Shoulder abduction    Shoulder internal rotation    Shoulder external rotation    Elbow flexion    Elbow extension    Wrist flexion    Wrist extension     (Blank rows = not tested) (Key: WFL = within functional limits not formally assessed, * = concordant pain, s = stiffness/stretching sensation, NT = not tested)  Comments:  06/25/2022 GH ROM grossly WNL flexion and abduction, painless   UPPER EXTREMITY MMT:  MMT Right 06/25/2022 Left 06/25/2022 Right 07/15/2022 Left 07/15/2022  Shoulder flexion 5 5 5/5 5/5  Shoulder extension      Shoulder abduction 5 5 5/5 5/55  Shoulder extension      Shoulder internal rotation 5 5 5/5 5/5  Shoulder external rotation 4* 4+ 5/5 5/5  Elbow flexion      Elbow extension      Grip strength      (Blank rows = not tested)  (Key: WFL = within functional limits not formally assessed, * = concordant pain, s = stiffness/stretching sensation, NT = not tested)  Comments: of note, with HEP pt demonstrates notably reduced control of periscapular musculature with frequent compensations at thoracolumbar spine   GAIT: 06/25/2022 Distance walked: within clinic Assistive device utilized: None Level of assistance: Complete Independence Comments: WNL   TODAY'S TREATMENT:  DATE: 08/02/2022 Therapeutic Exercise: Nu step L6 X 8 min LE/UE Seated row machine #20 2x15  (seated upper trap self stretch after sets 15 sec x 3 bilateral) Seated lat machine #20  2 x15 Standing blue band GH ext past legs 2-3 sec hold 2 x 10  Standing P ball roll up wall into bilat shoulder flexion and thoracic extension stretch 5 sec, 2X  10 Standing bilat ER green 2 x 10 Standing horizontal abduction green 2X10 Shoulder clock at wall with red band loop. X 3 each side Bent over Y,T,I with 2#, X 10 each bilat  DATE: 07/29/2022 Therapeutic Exercise: UBE Lvl 3, 3  min fwd/back each way  Seated row machine #20 2x15  (seated upper trap self stretch after sets 15 sec x 3 bilateral) Seated lat machine #20  2 x15 Standing blue band GH ext past legs 2-3 sec hold 2 x 10  Standing P ball roll up wall into bilat shoulder flexion and thoracic extension stretch 5 sec X 10 Standing bilat ER green 2 x 15  Standing horizontal abduction green 2X10 Shoulder clock at wall with red band loop. X 3 each side Bent over Y,T,I with 2#, X 10 each bilat  DATE: 07/20/2022 Therapeutic Exercise: UBE Lvl 3 3  min fwd/back each way with faster interval end of each minute :50-:60 self selected Seated row machine #20 2x15  (seated upper trap self stretch after sets 15 sec x 3 bilateral) Seated lat machine #20  2 x15 Standing blue band GH ext past legs 2-3 sec hold 2 x 10  Standing P ball roll up wall into bilat shoulder flexion and thoracic extension stretch 5 sec X 10 Standing bilat ER green 2 x 15  Standing horizontal abduction green 2X10 Counter top full plank position/push up position, moving into alternating shoulder flexion X 5 bilat  Manual Therapy: Manual therapy for skilled palpation and active tissue compression   Trigger Point Dry-Needling  Treatment instructions: Expect mild to moderate muscle soreness. Patient Consent Given: Yes Education handout provided: Previously Muscles treated:  bilat  infraspinatus, bilat thoracic paraspinals and multifidi Treatment response/outcome: good overall tolerance,twitch response noted    TODAY'S TREATMENT:                                                                                                           DATE: 07/15/2022 Therapeutic Exercise: UBE Lvl 3 4  min fwd/back each way with faster  interval end of each minute :50-:60 self selected Seated row machine #20 2x15  (seated upper trap self stretch after sets 15 sec x 3 bilateral) Seated lat machine #20  2 x10 Standing blue band GH ext past legs 2-3 sec hold 2 x 10  Serratus anterior forearm wall slide into flexion on foam roller x 10, on small pball x 10  Standing bilat ER green 2 x 15  Supine UC Flexion hold 5 sec x 10 Supine scapular retraction into table 5 sec hold x 10  Supine bilateral UE extension into table 5 sec hold x 10  PATIENT EDUCATION:  Education details: Pt education on PT impairments, prognosis, and POC. Informed consent. Rationale for interventions, safe/appropriate HEP performance Person educated: Patient Education method: Explanation, Demonstration, Tactile cues, Verbal cues, and Handouts Education comprehension: verbalized understanding, returned demonstration, verbal cues required, tactile cues required, and needs further education    HOME EXERCISE PROGRAM: Access Code: [email or web address] URL: [email or web address] Date: 07/02/2022 Prepared by: Chyrel Masson  Exercises - Seated Shoulder Press Ups Off Table  - 1-2 x daily - 7 x weekly - 3 sets - 10 reps - Shoulder External Rotation and Scapular Retraction with Resistance  - 1-2 x daily - 7 x weekly - 2-3 sets - 10 reps - Prone Scapular Retraction  - 2 x daily - 7 x weekly - 1 sets - 10 reps - 5 hold - Prone Scapular Slide with Shoulder Extension  - 2 x daily - 7 x weekly - 1 sets - 10 reps - 5 hold  ASSESSMENT:  CLINICAL IMPRESSION:  We continued to work to improve scapular and postural strength program. She does still get some pain with running so she was recommended to set up follow up appointment with MD about this. She deferred DN today.  OBJECTIVE IMPAIRMENTS: decreased activity tolerance, decreased mobility, decreased strength, increased muscle spasms, impaired UE functional use, postural dysfunction, and pain.   ACTIVITY  LIMITATIONS: carrying, lifting, bending, sitting, reach over head, and locomotion level  PARTICIPATION LIMITATIONS: community activity, occupation, and recreational exercise  PERSONAL FACTORS: Time since onset of injury/illness/exacerbation and 1 comorbidity: anxiety/depression  are also affecting patient's functional outcome.   REHAB POTENTIAL: Good  CLINICAL DECISION MAKING: Stable/uncomplicated  EVALUATION COMPLEXITY: Low   GOALS: Goals reviewed with patient? No  SHORT TERM GOALS: Target date: 07/16/2022 Pt will demonstrate appropriate understanding and performance of initially prescribed HEP in order to facilitate improved independence with management of symptoms.  Baseline: HEP provided on eval Goal status: Met 07/15/2022   LONG TERM GOALS: Target date: 08/06/2022 Pt will score 80% on FOTO in order to demonstrate improved perception of functional status due to symptoms.  Baseline: 74% Goal status: on going 07/15/2022  2.  Pt will demonstrate full thoracolumbar rotation AROM without pain in order to demonstrate improved tolerance to functional movement patterns.  Baseline: see ROM chart above Goal status: on going 07/15/2022  3.  Pt will demonstrate grossly symmetrical in order to demonstrate improved strength for functional movements.  Baseline: see MMT chart above Goal status: on going 07/15/2022  4. Pt will report ability to sit for up to 1 hour with less than 2/10 pain on NPS for improved tolerance to daily activities  Baseline: up to 8/10 pain with sitting for an hour  Goal status: on going 07/15/2022   5. Pt will be able to run for up to 3 miles with less than 3/10 thoracic pain on NPS in order to return to PLOF and maximize health/QOL.    Baseline: up to 8/10 pain w/ running 3 miles   Goal status: on going 07/15/2022  PLAN:  PT FREQUENCY: 2x/week  PT DURATION: 6 weeks  PLANNED INTERVENTIONS: Therapeutic exercises, Therapeutic activity, Neuromuscular re-education,  Balance training, Gait training, Patient/Family education, Self Care, Joint mobilization, Dry Needling, Electrical stimulation, Spinal mobilization, Cryotherapy, Moist heat, Taping, Manual therapy, and Re-evaluation. Above unless contraindicated.   PLAN FOR NEXT SESSION:, progressive postural strengthening.   Ivery Quale, PT, DPT 08/02/22 3:13 PM

## 2022-08-02 NOTE — Telephone Encounter (Signed)
Patient came by. Would like an appointment with Dr. Ernestina Patches. Her call back number is [phone]

## 2022-08-03 ENCOUNTER — Other Ambulatory Visit: Payer: Self-pay | Admitting: Physical Medicine and Rehabilitation

## 2022-08-03 DIAGNOSIS — M7918 Myalgia, other site: Secondary | ICD-10-CM

## 2022-08-03 DIAGNOSIS — G8929 Other chronic pain: Secondary | ICD-10-CM

## 2022-08-03 NOTE — Telephone Encounter (Signed)
Spoke with patient and she stated she is having pain and burning in upper back and shoulders. She is requesting an injection. Please advise

## 2022-08-04 NOTE — Telephone Encounter (Signed)
Informed patient that a thoracic MRI was placed and [facility] will be calling her to schedule. Once MRI is obtained, we will speak to her about the results

## 2022-08-05 ENCOUNTER — Encounter: Payer: Self-pay | Admitting: Physical Therapy

## 2022-08-05 ENCOUNTER — Ambulatory Visit: Payer: BC Managed Care – PPO | Admitting: Physical Therapy

## 2022-08-05 DIAGNOSIS — M546 Pain in thoracic spine: Secondary | ICD-10-CM | POA: Diagnosis not present

## 2022-08-05 DIAGNOSIS — R293 Abnormal posture: Secondary | ICD-10-CM | POA: Diagnosis not present

## 2022-08-05 DIAGNOSIS — M6281 Muscle weakness (generalized): Secondary | ICD-10-CM | POA: Diagnosis not present

## 2022-08-05 NOTE — Therapy (Signed)
OUTPATIENT PHYSICAL THERAPY TREATMENT   Patient Name: Shelley Armstrong MRN: [id number] DOB:09/03/1970, 52 y.o., female Today's Date: 08/05/2022  END OF SESSION:  PT End of Session - 08/05/22 1544     Visit Number 10    Number of Visits 13    Date for PT Re-Evaluation 08/20/22    Authorization Type BCBS    PT Start Time 1550    PT Stop Time 1645    PT Time Calculation (min) 55 min    Activity Tolerance Patient tolerated treatment well    Behavior During Therapy [facility] for tasks assessed/performed                  Past Medical History:  Diagnosis Date   Hypercholesterolemia    Past Surgical History:  Procedure Laterality Date   ROBOTIC ASSISTED LAP VAGINAL HYSTERECTOMY  11/2015   has ovaries   Patient Active Problem List   Diagnosis Date Noted   Greater trochanteric pain syndrome of left lower extremity 09/23/2021   Labral tear of hip, degenerative 09/15/2021   Patellofemoral pain syndrome of both knees 05/13/2021   Scapular dysfunction 08/13/2020   Right foot pain 06/11/2019   Back pain 04/21/2017   Low back pain radiating to right leg 02/15/2017   Hereditary and idiopathic peripheral neuropathy 04/19/2016   Long term use of drug 08/13/2014   Major depression, recurrent ([facility]) 10/24/2013   Generalized anxiety disorder 02/27/2013   Hyperlipidemia 12/02/2012    PCP: Caffie Damme, MD  REFERRING PROVIDER: Juanda Chance, NP  REFERRING DIAG: M79.18 (ICD-10-CM) - Myofascial pain syndrome M54.9 (ICD-10-CM) - Upper back pain M54.6,G89.29 (ICD-10-CM) - Chronic bilateral thoracic back pain  Rationale for Evaluation and Treatment: Rehabilitation  THERAPY DIAG:  Pain in thoracic spine  Abnormal posture  Muscle weakness (generalized)  ONSET DATE: a couple months ago, sudden onset   SUBJECTIVE:                                                                                                                                                                                            SUBJECTIVE STATEMENT: She states she is having more pain today and would like the DN performed. She also started having burning in her feet since Sunday.   PERTINENT HISTORY:  anxiety/depression, multiple MSK issues in past   PAIN:  Are you having pain: 5.5/10 Location: Lt middle  How would you describe your pain? Aching pain typically, occasional sharp pain Aggravating factors: cold, prolonged sitting Easing factors: heating pad, medication, repositioning   PRECAUTIONS: None  WEIGHT BEARING RESTRICTIONS: No  FALLS:  Has patient fallen in last 6 months? No  LIVING ENVIRONMENT: With spouse and cat, 1 story, no STE  OCCUPATION: works at [employer], has to lift heavy boxes of books  PLOF: Independent  PATIENT GOALS: reduce pain  OBJECTIVE:   DIAGNOSTIC FINDINGS:  06/25/2022 No recent imaging   PATIENT SURVEYS:   06/25/2022 FOTO 74% predicted 80%  SCREENING FOR RED FLAGS: 06/25/2022 Unremarkable for red flags  COGNITION: 06/25/2022 Overall cognitive status: Within functional limits for tasks assessed     SENSATION: 06/25/2022 Light touch intact all extremities   POSTURE:  06/25/2022 rounded shoulders, forward head, and increased thoracic kyphosis  PALPATION: 07/09/2022:  Trigger points c concordant symptoms in Lt mid thoracic paraspinals, Lt infraspinatus.   06/25/2022 TTP B middle/lower traps, rhomboids, lats. R infraspinatus/teres. Trigger points throughout Mild stiffness with grade 2-3 PA mobs throughout lower T spine, no overt pain, pt reports mild relief  Thoracic ROM:   Active  AROM  eval  Flexion   Extension   Right lateral flexion   Left lateral flexion   Right rotation   Left rotation    (Blank rows = not tested)  Cervical ROM grossly WNL all planes, painless   LUMBAR ROM:   AROM 06/25/2022  Flexion 100% painless (distal shin)  Extension 100%  Right lateral flexion   Left lateral flexion   Right rotation 100% *   Left rotation 100% *    (Blank rows = not tested) Comments: increased stiffness and reduction in rotation B w/ hips fixed compared to free hips, no change in pain  UPPER EXTREMITY ROM:  A/PROM Right eval Left eval  Shoulder flexion    Shoulder abduction    Shoulder internal rotation    Shoulder external rotation    Elbow flexion    Elbow extension    Wrist flexion    Wrist extension     (Blank rows = not tested) (Key: WFL = within functional limits not formally assessed, * = concordant pain, s = stiffness/stretching sensation, NT = not tested)  Comments:  06/25/2022 GH ROM grossly WNL flexion and abduction, painless   UPPER EXTREMITY MMT:  MMT Right 06/25/2022 Left 06/25/2022 Right 07/15/2022 Left 07/15/2022  Shoulder flexion 5 5 5/5 5/5  Shoulder extension      Shoulder abduction 5 5 5/5 5/55  Shoulder extension      Shoulder internal rotation 5 5 5/5 5/5  Shoulder external rotation 4* 4+ 5/5 5/5  Elbow flexion      Elbow extension      Grip strength      (Blank rows = not tested)  (Key: WFL = within functional limits not formally assessed, * = concordant pain, s = stiffness/stretching sensation, NT = not tested)  Comments: of note, with HEP pt demonstrates notably reduced control of periscapular musculature with frequent compensations at thoracolumbar spine   GAIT: 06/25/2022 Distance walked: within clinic Assistive device utilized: None Level of assistance: Complete Independence Comments: WNL   TODAY'S TREATMENT:  DATE: 08/05/2022 Therapeutic Exercise: Nu step L6 X 8 min LE/UE Seated row machine #20 2x10  (seated upper trap self stretch after sets 15 sec x 3 bilateral) Seated lat machine #20  2 x15 Standing blue band GH ext past legs 2-3 sec hold 2 x 10  Standing rows blue 2X10 Standing bilat ER red 2 x 10 Standing horizontal abduction red 2X10 Bent  over Y,T,I with 2#, X 10 each bilat Standing gastroc stretch 30 sec X 2 bilat Standing soleus stretch 30 sec X 2 bilat Seated plantarfascia stretch 30 sec X 2 bilat  Manual Therapy: Manual therapy for skilled palpation and active tissue compression   Trigger Point Dry-Needling  Treatment instructions: Expect mild to moderate muscle soreness. Patient Consent Given: Yes Education handout provided: Previously Muscles treated:  bilat thoracic paraspinals and multifidi, bilateral gastroc-solus Treatment response/outcome: good overall tolerance,twitch response noted    Moist heat to thoraic in prone X 8 min, not included in treatment time.    PATIENT EDUCATION:  Education details: Pt education on PT impairments, prognosis, and POC. Informed consent. Rationale for interventions, safe/appropriate HEP performance Person educated: Patient Education method: Explanation, Demonstration, Tactile cues, Verbal cues, and Handouts Education comprehension: verbalized understanding, returned demonstration, verbal cues required, tactile cues required, and needs further education    HOME EXERCISE PROGRAM: Access Code: [email or web address] URL: [email or web address] Date: 07/02/2022 Prepared by: Scot Jun  Exercises - Seated Shoulder Press Ups Off Table  - 1-2 x daily - 7 x weekly - 3 sets - 10 reps - Shoulder External Rotation and Scapular Retraction with Resistance  - 1-2 x daily - 7 x weekly - 2-3 sets - 10 reps - Prone Scapular Retraction  - 2 x daily - 7 x weekly - 1 sets - 10 reps - 5 hold - Prone Scapular Slide with Shoulder Extension  - 2 x daily - 7 x weekly - 1 sets - 10 reps - 5 hold  ASSESSMENT:  CLINICAL IMPRESSION:  She wanted to have the DN again today so we performed this for her. She was having burning sensations in her feet which may be plantarfasicits so I showed her some exercises and stretches to do for this as well.   OBJECTIVE IMPAIRMENTS: decreased activity tolerance,  decreased mobility, decreased strength, increased muscle spasms, impaired UE functional use, postural dysfunction, and pain.   ACTIVITY LIMITATIONS: carrying, lifting, bending, sitting, reach over head, and locomotion level  PARTICIPATION LIMITATIONS: community activity, occupation, and recreational exercise  PERSONAL FACTORS: Time since onset of injury/illness/exacerbation and 1 comorbidity: anxiety/depression  are also affecting patient's functional outcome.   REHAB POTENTIAL: Good  CLINICAL DECISION MAKING: Stable/uncomplicated  EVALUATION COMPLEXITY: Low   GOALS: Goals reviewed with patient? No  SHORT TERM GOALS: Target date: 07/16/2022 Pt will demonstrate appropriate understanding and performance of initially prescribed HEP in order to facilitate improved independence with management of symptoms.  Baseline: HEP provided on eval Goal status: Met 07/15/2022   LONG TERM GOALS: Target date: 08/06/2022 Pt will score 80% on FOTO in order to demonstrate improved perception of functional status due to symptoms.  Baseline: 74% Goal status: on going 07/15/2022  2.  Pt will demonstrate full thoracolumbar rotation AROM without pain in order to demonstrate improved tolerance to functional movement patterns.  Baseline: see ROM chart above Goal status: on going 07/15/2022  3.  Pt will demonstrate grossly symmetrical in order to demonstrate improved strength for functional movements.  Baseline: see MMT chart above Goal status: on going 07/15/2022  4. Pt will report ability to sit for up to 1 hour with less than 2/10 pain on NPS for improved tolerance to daily activities  Baseline: up to 8/10 pain with sitting for an hour  Goal status: on going 07/15/2022   5. Pt will be able to run for up to 3 miles with less than 3/10 thoracic pain on NPS in order to return to PLOF and maximize health/QOL.    Baseline: up to 8/10 pain w/ running 3 miles   Goal status: on going 07/15/2022  PLAN:  PT  FREQUENCY: 2x/week  PT DURATION: 6 weeks  PLANNED INTERVENTIONS: Therapeutic exercises, Therapeutic activity, Neuromuscular re-education, Balance training, Gait training, Patient/Family education, Self Care, Joint mobilization, Dry Needling, Electrical stimulation, Spinal mobilization, Cryotherapy, Moist heat, Taping, Manual therapy, and Re-evaluation. Above unless contraindicated.   PLAN FOR NEXT SESSION:, progressive postural strengthening.   Elsie Ra, PT, DPT 08/05/22 4:39 PM

## 2022-08-10 ENCOUNTER — Encounter: Payer: BC Managed Care – PPO | Admitting: Physical Therapy

## 2022-08-10 ENCOUNTER — Encounter: Payer: Self-pay | Admitting: Physical Therapy

## 2022-08-10 ENCOUNTER — Ambulatory Visit: Payer: BC Managed Care – PPO | Admitting: Physical Therapy

## 2022-08-10 DIAGNOSIS — M6281 Muscle weakness (generalized): Secondary | ICD-10-CM

## 2022-08-10 DIAGNOSIS — R293 Abnormal posture: Secondary | ICD-10-CM | POA: Diagnosis not present

## 2022-08-10 DIAGNOSIS — M546 Pain in thoracic spine: Secondary | ICD-10-CM

## 2022-08-10 NOTE — Therapy (Addendum)
OUTPATIENT PHYSICAL THERAPY TREATMENT   Patient Name: Shelley Armstrong MRN: [id number] DOB:Jul 04, 1970, 52 y.o., female Today's Date: 08/10/2022  END OF SESSION:  PT End of Session - 08/10/22 1026     Visit Number 11    Number of Visits 13    Date for PT Re-Evaluation 08/20/22    Authorization Type BCBS    PT Start Time 1018    PT Stop Time 1105    PT Time Calculation (min) 47 min    Activity Tolerance Patient tolerated treatment well    Behavior During Therapy [facility] for tasks assessed/performed                  Past Medical History:  Diagnosis Date   Hypercholesterolemia    Past Surgical History:  Procedure Laterality Date   ROBOTIC ASSISTED LAP VAGINAL HYSTERECTOMY  11/2015   has ovaries   Patient Active Problem List   Diagnosis Date Noted   Greater trochanteric pain syndrome of left lower extremity 09/23/2021   Labral tear of hip, degenerative 09/15/2021   Patellofemoral pain syndrome of both knees 05/13/2021   Scapular dysfunction 08/13/2020   Right foot pain 06/11/2019   Back pain 04/21/2017   Low back pain radiating to right leg 02/15/2017   Hereditary and idiopathic peripheral neuropathy 04/19/2016   Long term use of drug 08/13/2014   Major depression, recurrent ([place]) 10/24/2013   Generalized anxiety disorder 02/27/2013   Hyperlipidemia 12/02/2012    PCP: Glendon Axe, MD  REFERRING PROVIDER: Lorine Bears, NP  REFERRING DIAG: M79.18 (ICD-10-CM) - Myofascial pain syndrome M54.9 (ICD-10-CM) - Upper back pain M54.6,G89.29 (ICD-10-CM) - Chronic bilateral thoracic back pain  Rationale for Evaluation and Treatment: Rehabilitation  THERAPY DIAG:  Pain in thoracic spine  Abnormal posture  Muscle weakness (generalized)  ONSET DATE: a couple months ago, sudden onset   SUBJECTIVE:                                                                                                                                                                                            SUBJECTIVE STATEMENT: She states the back pain is much better today and is not having the burning sensations in her feet.   PERTINENT HISTORY:  anxiety/depression, multiple MSK issues in past   PAIN:  Are you having pain: 5.5/10 Location: Lt middle  How would you describe your pain? Aching pain typically, occasional sharp pain Aggravating factors: cold, prolonged sitting Easing factors: heating pad, medication, repositioning   PRECAUTIONS: None  WEIGHT BEARING RESTRICTIONS: No  FALLS:  Has patient fallen in last 6 months? No  LIVING ENVIRONMENT: With spouse and  cat, 1 story, no STE  OCCUPATION: works at [employer], has to lift heavy boxes of books  PLOF: Independent  PATIENT GOALS: reduce pain  OBJECTIVE:   DIAGNOSTIC FINDINGS:  06/25/2022 No recent imaging   PATIENT SURVEYS:   06/25/2022 FOTO 74% predicted 80%  SCREENING FOR RED FLAGS: 06/25/2022 Unremarkable for red flags  COGNITION: 06/25/2022 Overall cognitive status: Within functional limits for tasks assessed     SENSATION: 06/25/2022 Light touch intact all extremities   POSTURE:  06/25/2022 rounded shoulders, forward head, and increased thoracic kyphosis  PALPATION: 07/09/2022:  Trigger points c concordant symptoms in Lt mid thoracic paraspinals, Lt infraspinatus.   06/25/2022 TTP B middle/lower traps, rhomboids, lats. R infraspinatus/teres. Trigger points throughout Mild stiffness with grade 2-3 PA mobs throughout lower T spine, no overt pain, pt reports mild relief  Thoracic ROM:   Active  AROM  eval  Flexion   Extension   Right lateral flexion   Left lateral flexion   Right rotation   Left rotation    (Blank rows = not tested)  Cervical ROM grossly WNL all planes, painless   LUMBAR ROM:   AROM 06/25/2022  Flexion 100% painless (distal shin)  Extension 100%  Right lateral flexion   Left lateral flexion   Right rotation 100% *  Left rotation 100% *    (Blank  rows = not tested) Comments: increased stiffness and reduction in rotation B w/ hips fixed compared to free hips, no change in pain  UPPER EXTREMITY ROM:  A/PROM Right eval Left eval  Shoulder flexion    Shoulder abduction    Shoulder internal rotation    Shoulder external rotation    Elbow flexion    Elbow extension    Wrist flexion    Wrist extension     (Blank rows = not tested) (Key: WFL = within functional limits not formally assessed, * = concordant pain, s = stiffness/stretching sensation, NT = not tested)  Comments:  06/25/2022 GH ROM grossly WNL flexion and abduction, painless   UPPER EXTREMITY MMT:  MMT Right 06/25/2022 Left 06/25/2022 Right 07/15/2022 Left 07/15/2022  Shoulder flexion 5 5 5/5 5/5  Shoulder extension      Shoulder abduction 5 5 5/5 5/55  Shoulder extension      Shoulder internal rotation 5 5 5/5 5/5  Shoulder external rotation 4* 4+ 5/5 5/5  Elbow flexion      Elbow extension      Grip strength      (Blank rows = not tested)  (Key: WFL = within functional limits not formally assessed, * = concordant pain, s = stiffness/stretching sensation, NT = not tested)  Comments: of note, with HEP pt demonstrates notably reduced control of periscapular musculature with frequent compensations at thoracolumbar spine   GAIT: 06/25/2022 Distance walked: within clinic Assistive device utilized: None Level of assistance: Complete Independence Comments: WNL   TODAY'S TREATMENT:  DATE: 08/10/2022 Therapeutic Exercise: UBE L3 3 min fwd, 3 min retro Seated row machine #20 2x12  Seated lat machine #20  2 x12 Standing blue band GH ext past legs 2-3 sec hold 2 x 10  Standing rows blue 2X10 Standing bilat ER red 2 x 10 Standing horizontal abduction red 2X10 Standing shoulder flexion bilat 3# bar 10, 5 reps Bent over Y,T,I with 2#, X 10 each  bilat Standing gastroc stretch 30 sec X 2 bilat on slantboard Standing soleus stretch 30 sec X 2 bilat on slantboard Seated plantarfascia stretch 30 sec X 2 bilat    Moist heat to thoraic in prone X 8 min, not included in treatment time.  DN deferred today  DATE: 08/05/2022 Therapeutic Exercise: Nu step L6 X 8 min LE/UE Seated row machine #20 2x10  (seated upper trap self stretch after sets 15 sec x 3 bilateral) Seated lat machine #20  2 x15 Standing blue band GH ext past legs 2-3 sec hold 2 x 10  Standing rows blue 2X10 Standing bilat ER red 2 x 10 Standing horizontal abduction red 2X10 Bent over Y,T,I with 2#, X 10 each bilat Standing gastroc stretch 30 sec X 2 bilat Standing soleus stretch 30 sec X 2 bilat Seated plantarfascia stretch 30 sec X 2 bilat  Manual Therapy: Manual therapy for skilled palpation and active tissue compression   Trigger Point Dry-Needling  Treatment instructions: Expect mild to moderate muscle soreness. Patient Consent Given: Yes Education handout provided: Previously Muscles treated:  bilat thoracic paraspinals and multifidi, bilateral gastroc-solus Treatment response/outcome: good overall tolerance,twitch response noted    Moist heat to thoraic in prone X 8 min, not included in treatment time.    PATIENT EDUCATION:  Education details: Pt education on PT impairments, prognosis, and POC. Informed consent. Rationale for interventions, safe/appropriate HEP performance Person educated: Patient Education method: Explanation, Demonstration, Tactile cues, Verbal cues, and Handouts Education comprehension: verbalized understanding, returned demonstration, verbal cues required, tactile cues required, and needs further education    HOME EXERCISE PROGRAM: Access Code: [email or web address] URL: [email or web address] Date: 07/02/2022 Prepared by: Scot Jun  Exercises - Seated Shoulder Press Ups Off Table  - 1-2 x daily - 7 x weekly - 3 sets - 10  reps - Shoulder External Rotation and Scapular Retraction with Resistance  - 1-2 x daily - 7 x weekly - 2-3 sets - 10 reps - Prone Scapular Retraction  - 2 x daily - 7 x weekly - 1 sets - 10 reps - 5 hold - Prone Scapular Slide with Shoulder Extension  - 2 x daily - 7 x weekly - 1 sets - 10 reps - 5 hold  ASSESSMENT:  CLINICAL IMPRESSION:  She was doing overall better with pain today and had tolerance to program today for scapular and thoracic strengthening. She deferred DN today. Continue current PT plan.  OBJECTIVE IMPAIRMENTS: decreased activity tolerance, decreased mobility, decreased strength, increased muscle spasms, impaired UE functional use, postural dysfunction, and pain.   ACTIVITY LIMITATIONS: carrying, lifting, bending, sitting, reach over head, and locomotion level  PARTICIPATION LIMITATIONS: community activity, occupation, and recreational exercise  PERSONAL FACTORS: Time since onset of injury/illness/exacerbation and 1 comorbidity: anxiety/depression  are also affecting patient's functional outcome.   REHAB POTENTIAL: Good  CLINICAL DECISION MAKING: Stable/uncomplicated  EVALUATION COMPLEXITY: Low   GOALS: Goals reviewed with patient? No  SHORT TERM GOALS: Target date: 07/16/2022 Pt will demonstrate appropriate understanding and performance of initially prescribed HEP in order to facilitate improved independence with  management of symptoms.  Baseline: HEP provided on eval Goal status: Met 07/15/2022   LONG TERM GOALS: Target date: 08/06/2022 Pt will score 80% on FOTO in order to demonstrate improved perception of functional status due to symptoms.  Baseline: 74% Goal status: on going 07/15/2022  2.  Pt will demonstrate full thoracolumbar rotation AROM without pain in order to demonstrate improved tolerance to functional movement patterns.  Baseline: see ROM chart above Goal status: on going 07/15/2022  3.  Pt will demonstrate grossly symmetrical in order to  demonstrate improved strength for functional movements.  Baseline: see MMT chart above Goal status: on going 07/15/2022  4. Pt will report ability to sit for up to 1 hour with less than 2/10 pain on NPS for improved tolerance to daily activities  Baseline: up to 8/10 pain with sitting for an hour  Goal status: on going 07/15/2022   5. Pt will be able to run for up to 3 miles with less than 3/10 thoracic pain on NPS in order to return to PLOF and maximize health/QOL.    Baseline: up to 8/10 pain w/ running 3 miles   Goal status: on going 07/15/2022  PLAN:  PT FREQUENCY: 2x/week  PT DURATION: 6 weeks  PLANNED INTERVENTIONS: Therapeutic exercises, Therapeutic activity, Neuromuscular re-education, Balance training, Gait training, Patient/Family education, Self Care, Joint mobilization, Dry Needling, Electrical stimulation, Spinal mobilization, Cryotherapy, Moist heat, Taping, Manual therapy, and Re-evaluation. Above unless contraindicated.   PLAN FOR NEXT SESSION:, progressive postural strengthening. DN PRN, likes heat at end.  Elsie Ra, PT, DPT 08/10/22 10:58 AM

## 2022-08-13 ENCOUNTER — Ambulatory Visit: Payer: BC Managed Care – PPO | Admitting: Physical Therapy

## 2022-08-13 ENCOUNTER — Encounter: Payer: Self-pay | Admitting: Physical Therapy

## 2022-08-13 DIAGNOSIS — M546 Pain in thoracic spine: Secondary | ICD-10-CM | POA: Diagnosis not present

## 2022-08-13 DIAGNOSIS — R293 Abnormal posture: Secondary | ICD-10-CM

## 2022-08-13 DIAGNOSIS — M6281 Muscle weakness (generalized): Secondary | ICD-10-CM

## 2022-08-13 NOTE — Therapy (Signed)
OUTPATIENT PHYSICAL THERAPY TREATMENT   Patient Name: Shelley Armstrong MRN: [id number] DOB:01-30-1971, 52 y.o., female Today's Date: 08/13/2022  END OF SESSION:  PT End of Session - 08/13/22 0847     Visit Number 12    Number of Visits 13    Date for PT Re-Evaluation 08/20/22    Authorization Type BCBS    PT Start Time 0848    PT Stop Time 0930    PT Time Calculation (min) 42 min    Activity Tolerance Patient tolerated treatment well    Behavior During Therapy [facility] for tasks assessed/performed                  Past Medical History:  Diagnosis Date   Hypercholesterolemia    Past Surgical History:  Procedure Laterality Date   ROBOTIC ASSISTED LAP VAGINAL HYSTERECTOMY  11/2015   has ovaries   Patient Active Problem List   Diagnosis Date Noted   Greater trochanteric pain syndrome of left lower extremity 09/23/2021   Labral tear of hip, degenerative 09/15/2021   Patellofemoral pain syndrome of both knees 05/13/2021   Scapular dysfunction 08/13/2020   Right foot pain 06/11/2019   Back pain 04/21/2017   Low back pain radiating to right leg 02/15/2017   Hereditary and idiopathic peripheral neuropathy 04/19/2016   Long term use of drug 08/13/2014   Major depression, recurrent ([place]) 10/24/2013   Generalized anxiety disorder 02/27/2013   Hyperlipidemia 12/02/2012    PCP: Glendon Axe, MD  REFERRING PROVIDER: Lorine Bears, NP  REFERRING DIAG: M79.18 (ICD-10-CM) - Myofascial pain syndrome M54.9 (ICD-10-CM) - Upper back pain M54.6,G89.29 (ICD-10-CM) - Chronic bilateral thoracic back pain  Rationale for Evaluation and Treatment: Rehabilitation  THERAPY DIAG:  Pain in thoracic spine  Abnormal posture  Muscle weakness (generalized)  ONSET DATE: a couple months ago, sudden onset   SUBJECTIVE:                                                                                                                                                                                            SUBJECTIVE STATEMENT: She reports some pain and stiffness in her neck from sleeping wrong, but other than that she said she feels good.   PERTINENT HISTORY:  anxiety/depression, multiple MSK issues in past   PAIN:  Are you having pain: 4/10 Location: Lt middle  How would you describe your pain? Aching pain typically, occasional sharp pain Aggravating factors: cold, prolonged sitting Easing factors: heating pad, medication, repositioning   PRECAUTIONS: None  WEIGHT BEARING RESTRICTIONS: No  FALLS:  Has patient fallen in last 6 months? No  LIVING ENVIRONMENT: With  spouse and cat, 1 story, no STE  OCCUPATION: works at [employer], has to lift heavy boxes of books  PLOF: Independent  PATIENT GOALS: reduce pain  OBJECTIVE:   DIAGNOSTIC FINDINGS:  06/25/2022 No recent imaging   PATIENT SURVEYS:   06/25/2022 FOTO 74% predicted 80%  SCREENING FOR RED FLAGS: 06/25/2022 Unremarkable for red flags  COGNITION: 06/25/2022 Overall cognitive status: Within functional limits for tasks assessed     SENSATION: 06/25/2022 Light touch intact all extremities   POSTURE:  06/25/2022 rounded shoulders, forward head, and increased thoracic kyphosis  PALPATION: 07/09/2022:  Trigger points c concordant symptoms in Lt mid thoracic paraspinals, Lt infraspinatus.   06/25/2022 TTP B middle/lower traps, rhomboids, lats. R infraspinatus/teres. Trigger points throughout Mild stiffness with grade 2-3 PA mobs throughout lower T spine, no overt pain, pt reports mild relief  Thoracic ROM:   Active  AROM  eval  Flexion   Extension   Right lateral flexion   Left lateral flexion   Right rotation   Left rotation    (Blank rows = not tested)  Cervical ROM grossly WNL all planes, painless   LUMBAR ROM:   AROM 06/25/2022  Flexion 100% painless (distal shin)  Extension 100%  Right lateral flexion   Left lateral flexion   Right rotation 100% *  Left rotation 100%  *    (Blank rows = not tested) Comments: increased stiffness and reduction in rotation B w/ hips fixed compared to free hips, no change in pain  UPPER EXTREMITY ROM:  A/PROM Right eval Left eval  Shoulder flexion    Shoulder abduction    Shoulder internal rotation    Shoulder external rotation    Elbow flexion    Elbow extension    Wrist flexion    Wrist extension     (Blank rows = not tested) (Key: WFL = within functional limits not formally assessed, * = concordant pain, s = stiffness/stretching sensation, NT = not tested)  Comments:  06/25/2022 GH ROM grossly WNL flexion and abduction, painless   UPPER EXTREMITY MMT:  MMT Right 06/25/2022 Left 06/25/2022 Right 07/15/2022 Left 07/15/2022  Shoulder flexion 5 5 5/5 5/5  Shoulder extension      Shoulder abduction 5 5 5/5 5/55  Shoulder extension      Shoulder internal rotation 5 5 5/5 5/5  Shoulder external rotation 4* 4+ 5/5 5/5  Elbow flexion      Elbow extension      Grip strength      (Blank rows = not tested)  (Key: WFL = within functional limits not formally assessed, * = concordant pain, s = stiffness/stretching sensation, NT = not tested)  Comments: of note, with HEP pt demonstrates notably reduced control of periscapular musculature with frequent compensations at thoracolumbar spine   GAIT: 06/25/2022 Distance walked: within clinic Assistive device utilized: None Level of assistance: Complete Independence Comments: WNL   TODAY'S TREATMENT:  DATE: 08/10/2022 Therapeutic Exercise:  UBE L3 3 min fwd, 3 min retro Seated row machine #20 2x12  Seated lat machine #20  2 x12 Standing blue band GH ext past legs 2-3 sec hold 2 x 10  Standing rows blue 2X10 Standing bilat ER red 2 x 10 Standing horizontal abduction red 2X10 Bent over Y,T,I with 2#, X 10 each bilat Standing shoulder flexion 3# 2x10 bilat   Standing shoulder abduction 3# 10, 7 bilat  Standing gastroc stretch 30 sec X 2 bilat on slantboard Standing soleus stretch 30 sec X 2 bilat on slantboard Seated plantarfascia stretch 30 sec X 2 bilat  Upper trap stretch with overpressure hold for 15 sec x2 bilat  Manual Therapy: Manual therapy for skilled palpation and active tissue compression   Trigger Point Dry-Needling  Treatment instructions: Expect mild to moderate muscle soreness. Patient Consent Given: Yes Education handout provided: Previously Muscles treated: thoracic and cervical paraspinals and multifidi Treatment response/outcome: good overall tolerance,twitch response noted    Moist heat to cervical/shoulder region in prone X 8 min, not included in treatment time.  DATE: 08/05/2022 Therapeutic Exercise: Nu step L6 X 8 min LE/UE Seated row machine #20 2x10  (seated upper trap self stretch after sets 15 sec x 3 bilateral) Seated lat machine #20  2 x15 Standing blue band GH ext past legs 2-3 sec hold 2 x 10  Standing rows blue 2X10 Standing bilat ER red 2 x 10 Standing horizontal abduction red 2X10 Bent over Y,T,I with 2#, X 10 each bilat Standing gastroc stretch 30 sec X 2 bilat Standing soleus stretch 30 sec X 2 bilat Seated plantarfascia stretch 30 sec X 2 bilat   Manual Therapy: Manual therapy for skilled palpation and active tissue compression   Trigger Point Dry-Needling (Performed by Elsie Ra DPT, PT) Treatment instructions: Expect mild to moderate muscle soreness. Patient Consent Given: Yes Education handout provided: Previously Muscles treated:  bilat thoracic paraspinals and multifidi, bilateral gastroc-solus Treatment response/outcome: good overall tolerance,twitch response noted    Moist heat to thoraic in prone X 8 min, not included in treatment time.   PATIENT EDUCATION:  Education details: Pt education on PT impairments, prognosis, and POC. Informed consent. Rationale for interventions,  safe/appropriate HEP performance Person educated: Patient Education method: Explanation, Demonstration, Tactile cues, Verbal cues, and Handouts Education comprehension: verbalized understanding, returned demonstration, verbal cues required, tactile cues required, and needs further education    HOME EXERCISE PROGRAM: Access Code: [email or web address] URL: [email or web address] Date: 07/02/2022 Prepared by: Scot Jun  Exercises - Seated Shoulder Press Ups Off Table  - 1-2 x daily - 7 x weekly - 3 sets - 10 reps - Shoulder External Rotation and Scapular Retraction with Resistance  - 1-2 x daily - 7 x weekly - 2-3 sets - 10 reps - Prone Scapular Retraction  - 2 x daily - 7 x weekly - 1 sets - 10 reps - 5 hold - Prone Scapular Slide with Shoulder Extension  - 2 x daily - 7 x weekly - 1 sets - 10 reps - 5 hold  ASSESSMENT:  CLINICAL IMPRESSION:  She showed good tolerance to treatment session today. She is doing well strength wise, however she continues to have some flare ups in pain every few sessions but this does not seem limit her exercises in session. She has an MRI scheduled for Sunday and she has two visits left scheduled. Based on the MRI and how she is feeling we plan to work toward an independent  program if she feels ready.   OBJECTIVE IMPAIRMENTS: decreased activity tolerance, decreased mobility, decreased strength, increased muscle spasms, impaired UE functional use, postural dysfunction, and pain.   ACTIVITY LIMITATIONS: carrying, lifting, bending, sitting, reach over head, and locomotion level  PARTICIPATION LIMITATIONS: community activity, occupation, and recreational exercise  PERSONAL FACTORS: Time since onset of injury/illness/exacerbation and 1 comorbidity: anxiety/depression  are also affecting patient's functional outcome.   REHAB POTENTIAL: Good  CLINICAL DECISION MAKING: Stable/uncomplicated  EVALUATION COMPLEXITY: Low   GOALS: Goals reviewed with patient?  No  SHORT TERM GOALS: Target date: 07/16/2022 Pt will demonstrate appropriate understanding and performance of initially prescribed HEP in order to facilitate improved independence with management of symptoms.  Baseline: HEP provided on eval Goal status: Met 07/15/2022   LONG TERM GOALS: Target date: 08/06/2022 Pt will score 80% on FOTO in order to demonstrate improved perception of functional status due to symptoms.  Baseline: 74% Goal status: on going 07/15/2022  2.  Pt will demonstrate full thoracolumbar rotation AROM without pain in order to demonstrate improved tolerance to functional movement patterns.  Baseline: see ROM chart above Goal status: on going 07/15/2022  3.  Pt will demonstrate grossly symmetrical in order to demonstrate improved strength for functional movements.  Baseline: see MMT chart above Goal status: on going 07/15/2022  4. Pt will report ability to sit for up to 1 hour with less than 2/10 pain on NPS for improved tolerance to daily activities  Baseline: up to 8/10 pain with sitting for an hour  Goal status: on going 07/15/2022   5. Pt will be able to run for up to 3 miles with less than 3/10 thoracic pain on NPS in order to return to PLOF and maximize health/QOL.    Baseline: up to 8/10 pain w/ running 3 miles   Goal status: on going 07/15/2022  PLAN:  PT FREQUENCY: 2x/week  PT DURATION: 6 weeks  PLANNED INTERVENTIONS: Therapeutic exercises, Therapeutic activity, Neuromuscular re-education, Balance training, Gait training, Patient/Family education, Self Care, Joint mobilization, Dry Needling, Electrical stimulation, Spinal mobilization, Cryotherapy, Moist heat, Taping, Manual therapy, and Re-evaluation. Above unless contraindicated.   PLAN FOR NEXT SESSION:, progressive postural strengthening. DN PRN, likes heat at end. Work to transition to independent program  Siana Panameno, SPT 08/13/22 9:43 AM

## 2022-08-15 ENCOUNTER — Ambulatory Visit
Admission: RE | Admit: 2022-08-15 | Discharge: 2022-08-15 | Disposition: A | Discharge status: Home / Self Care | Payer: BC Managed Care – PPO | Source: Ambulatory Visit | Attending: Physical Medicine and Rehabilitation | Admitting: Physical Medicine and Rehabilitation

## 2022-08-15 DIAGNOSIS — M7918 Myalgia, other site: Secondary | ICD-10-CM

## 2022-08-15 DIAGNOSIS — G8929 Other chronic pain: Secondary | ICD-10-CM

## 2022-08-15 DIAGNOSIS — M549 Dorsalgia, unspecified: Secondary | ICD-10-CM | POA: Diagnosis not present

## 2022-08-17 ENCOUNTER — Encounter: Payer: BC Managed Care – PPO | Admitting: Physical Therapy

## 2022-08-17 ENCOUNTER — Ambulatory Visit: Payer: BC Managed Care – PPO | Admitting: Physical Therapy

## 2022-08-17 ENCOUNTER — Encounter: Payer: Self-pay | Admitting: Physical Therapy

## 2022-08-17 ENCOUNTER — Telehealth: Payer: Self-pay | Admitting: Physical Medicine and Rehabilitation

## 2022-08-17 DIAGNOSIS — M546 Pain in thoracic spine: Secondary | ICD-10-CM | POA: Diagnosis not present

## 2022-08-17 DIAGNOSIS — M6281 Muscle weakness (generalized): Secondary | ICD-10-CM | POA: Diagnosis not present

## 2022-08-17 DIAGNOSIS — R293 Abnormal posture: Secondary | ICD-10-CM

## 2022-08-17 NOTE — Therapy (Signed)
OUTPATIENT PHYSICAL THERAPY TREATMENT/REcert   Patient Name: Shelley Armstrong MRN: [id number] DOB:11/16/1970, 52 y.o., female Today's Date: 08/17/2022  END OF SESSION:  PT End of Session - 08/17/22 1022     Visit Number 13    Number of Visits 21    Date for PT Re-Evaluation 09/28/22    Authorization Type BCBS    PT Start Time 1016    PT Stop Time 1100    PT Time Calculation (min) 44 min    Activity Tolerance Patient tolerated treatment well    Behavior During Therapy [facility] for tasks assessed/performed                   Past Medical History:  Diagnosis Date   Hypercholesterolemia    Past Surgical History:  Procedure Laterality Date   ROBOTIC ASSISTED LAP VAGINAL HYSTERECTOMY  11/2015   has ovaries   Patient Active Problem List   Diagnosis Date Noted   Greater trochanteric pain syndrome of left lower extremity 09/23/2021   Labral tear of hip, degenerative 09/15/2021   Patellofemoral pain syndrome of both knees 05/13/2021   Scapular dysfunction 08/13/2020   Right foot pain 06/11/2019   Back pain 04/21/2017   Low back pain radiating to right leg 02/15/2017   Hereditary and idiopathic peripheral neuropathy 04/19/2016   Long term use of drug 08/13/2014   Major depression, recurrent ([place]) 10/24/2013   Generalized anxiety disorder 02/27/2013   Hyperlipidemia 12/02/2012    PCP: Glendon Axe, MD  REFERRING PROVIDER: Lorine Bears, NP  REFERRING DIAG: M79.18 (ICD-10-CM) - Myofascial pain syndrome M54.9 (ICD-10-CM) - Upper back pain M54.6,G89.29 (ICD-10-CM) - Chronic bilateral thoracic back pain  Rationale for Evaluation and Treatment: Rehabilitation  THERAPY DIAG:  Pain in thoracic spine  Abnormal posture  Muscle weakness (generalized)  ONSET DATE: a couple months ago, sudden onset   SUBJECTIVE:                                                                                                                                                                                            SUBJECTIVE STATEMENT: She reports she had new MRI, and that MD wanted her to continue with PT. PERTINENT HISTORY:  anxiety/depression, multiple MSK issues in past   PAIN:  Are you having pain: 5/10 Location: Lt middle  How would you describe your pain? Aching pain typically, occasional sharp pain Aggravating factors: cold, prolonged sitting Easing factors: heating pad, medication, repositioning   PRECAUTIONS: None  WEIGHT BEARING RESTRICTIONS: No  FALLS:  Has patient fallen in last 6 months? No  LIVING ENVIRONMENT: With spouse and cat, 1 story, no STE  OCCUPATION: works at [employer], has to lift heavy boxes of books  PLOF: Independent  PATIENT GOALS: reduce pain  OBJECTIVE:   DIAGNOSTIC FINDINGS:  08/17/22 Thoracic MRI IMPRESSION: Mild degenerative disc disease at T5-T6 and T7-T8. No evidence of high-grade spinal canal or neural foraminal stenosis. There is a small disc protrusion at T5-T6. There is a small disc bulge at T7-T8.  PATIENT SURVEYS:   06/25/2022 FOTO 74% predicted 80%  SCREENING FOR RED FLAGS: 06/25/2022 Unremarkable for red flags  COGNITION: 06/25/2022 Overall cognitive status: Within functional limits for tasks assessed     SENSATION: 06/25/2022 Light touch intact all extremities   POSTURE:  06/25/2022 rounded shoulders, forward head, and increased thoracic kyphosis  PALPATION: 07/09/2022:  Trigger points c concordant symptoms in Lt mid thoracic paraspinals, Lt infraspinatus.   06/25/2022 TTP B middle/lower traps, rhomboids, lats. R infraspinatus/teres. Trigger points throughout Mild stiffness with grade 2-3 PA mobs throughout lower T spine, no overt pain, pt reports mild relief  Thoracic ROM:   Active  AROM  eval  Flexion   Extension   Right lateral flexion   Left lateral flexion   Right rotation   Left rotation    (Blank rows = not tested)  Cervical ROM grossly WNL all planes, painless   LUMBAR  ROM:   AROM 06/25/2022  Flexion 100% painless (distal shin)  Extension 100%  Right lateral flexion   Left lateral flexion   Right rotation 100% *  Left rotation 100% *    (Blank rows = not tested) Comments: increased stiffness and reduction in rotation B w/ hips fixed compared to free hips, no change in pain  UPPER EXTREMITY ROM:  A/PROM Right eval Left eval  Shoulder flexion    Shoulder abduction    Shoulder internal rotation    Shoulder external rotation    Elbow flexion    Elbow extension    Wrist flexion    Wrist extension     (Blank rows = not tested) (Key: WFL = within functional limits not formally assessed, * = concordant pain, s = stiffness/stretching sensation, NT = not tested)  Comments:  06/25/2022 GH ROM grossly WNL flexion and abduction, painless   UPPER EXTREMITY MMT:  MMT Right 06/25/2022 Left 06/25/2022 Right 07/15/2022 Left 07/15/2022  Shoulder flexion 5 5 5/5 5/5  Shoulder extension      Shoulder abduction 5 5 5/5 5/55  Shoulder extension      Shoulder internal rotation 5 5 5/5 5/5  Shoulder external rotation 4* 4+ 5/5 5/5  Elbow flexion      Elbow extension      Grip strength      (Blank rows = not tested)  (Key: WFL = within functional limits not formally assessed, * = concordant pain, s = stiffness/stretching sensation, NT = not tested)  Comments: of note, with HEP pt demonstrates notably reduced control of periscapular musculature with frequent compensations at thoracolumbar spine   GAIT: 06/25/2022 Distance walked: within clinic Assistive device utilized: None Level of assistance: Complete Independence Comments: WNL   TODAY'S TREATMENT:  DATE: 08/17/2022 Therapeutic Exercise:  UBE L3 2.5 min fwd, 2.5 min retro Standing lumbar-thoracic extensions SNAG with strap 3 sec 2X10 Seated thoracic extension over chair back 3 sec X  10 Quadriped cat-camel 5 sec X 10 each Sidelying thoracic rotations 5 sec X 10 Prone Y, T, I, 3 sec each X 10 bilat   Manual Therapy: Central PA mobs T4-8 grade 2 in prone Manual therapy for skilled palpation and active tissue compression   Trigger Point Dry-Needling  Treatment instructions: Expect mild to moderate muscle soreness. Patient Consent Given: Yes Education handout provided: Previously Muscles treated: thoracic and cervical paraspinals and multifidi Treatment response/outcome: good overall tolerance,twitch response noted    Moist heat to cervical/shoulder region in prone X 8 min, not included in treatment time.  DATE: 08/10/2022 Therapeutic Exercise:  UBE L3 3 min fwd, 3 min retro Seated row machine #20 2x12  Seated lat machine #20  2 x12 Standing blue band GH ext past legs 2-3 sec hold 2 x 10  Standing rows blue 2X10 Standing bilat ER red 2 x 10 Standing horizontal abduction red 2X10 Bent over Y,T,I with 2#, X 10 each bilat Standing shoulder flexion 3# 2x10 bilat  Standing shoulder abduction 3# 10, 7 bilat  Standing gastroc stretch 30 sec X 2 bilat on slantboard Standing soleus stretch 30 sec X 2 bilat on slantboard Seated plantarfascia stretch 30 sec X 2 bilat  Upper trap stretch with overpressure hold for 15 sec x2 bilat  Manual Therapy: Manual therapy for skilled palpation and active tissue compression   Trigger Point Dry-Needling  Treatment instructions: Expect mild to moderate muscle soreness. Patient Consent Given: Yes Education handout provided: Previously Muscles treated: thoracic and cervical paraspinals and multifidi Treatment response/outcome: good overall tolerance,twitch response noted    Moist heat to cervical/shoulder region in prone X 8 min, not included in treatment time.    PATIENT EDUCATION:  Education details: Pt education on PT impairments, prognosis, and POC. Informed consent. Rationale for interventions, safe/appropriate HEP  performance Person educated: Patient Education method: Explanation, Demonstration, Tactile cues, Verbal cues, and Handouts Education comprehension: verbalized understanding, returned demonstration, verbal cues required, tactile cues required, and needs further education    HOME EXERCISE PROGRAM: Access Code: [email or web address] URL: [email or web address] Date: 08/17/2022 Prepared by: Elsie Ra  Exercises - Standing Lumbar Extension  - 2 x daily - 6 x weekly - 1-2 sets - 10 reps - 3 sec hold - Seated Thoracic Lumbar Extension with Pectoralis Stretch  - 2 x daily - 6 x weekly - 1-2 sets - 10 reps - 3 sec hold - Cat Cow  - 2 x daily - 6 x weekly - 1 sets - 10 reps - 5 hold - Prone Scapular Slide with Shoulder Extension  - 2 x daily - 7 x weekly - 1 sets - 10 reps - 3 sec hold - Prone Scapular Retraction Arms at Side  - 2 x daily - 6 x weekly - 1 sets - 10 reps - 3 sec hold - Prone Scapular Retraction Y  - 2 x daily - 6 x weekly - 1 sets - 10 reps - 3 sec hold - Shoulder External Rotation and Scapular Retraction with Resistance  - 1-2 x daily - 7 x weekly - 2-3 sets - 10 reps - Standing Row with Anchored Resistance  - 2 x daily - 6 x weekly - 2-3 sets - 10-20 reps - Shoulder extension with resistance - Neutral  - 2 x daily - 6  x weekly - 2-3 sets - 10 reps  ASSESSMENT:  CLINICAL IMPRESSION:  She had new MRI that was relatively good, did show small disc bulge in thoracic so we provided more extension based exercises for her to try to aide this. MD staff feels this is still myofascial type pain so we continued with DN treatment to address this. I updated and printed her HEP, this was reviewed with her and she shows good understanding. PT recommending to continue PT up to another 6-8 visits to reduce pain and improve function as she has not yet met her PT goals.  OBJECTIVE IMPAIRMENTS: decreased activity tolerance, decreased mobility, decreased strength, increased muscle spasms, impaired UE  functional use, postural dysfunction, and pain.   ACTIVITY LIMITATIONS: carrying, lifting, bending, sitting, reach over head, and locomotion level  PARTICIPATION LIMITATIONS: community activity, occupation, and recreational exercise  PERSONAL FACTORS: Time since onset of injury/illness/exacerbation and 1 comorbidity: anxiety/depression  are also affecting patient's functional outcome.   REHAB POTENTIAL: Good  CLINICAL DECISION MAKING: Stable/uncomplicated  EVALUATION COMPLEXITY: Low   GOALS: Goals reviewed with patient? No  SHORT TERM GOALS: Target date: 07/16/2022 Pt will demonstrate appropriate understanding and performance of initially prescribed HEP in order to facilitate improved independence with management of symptoms.  Baseline: HEP provided on eval Goal status: Met 07/15/2022   LONG TERM GOALS: Target date: 09/28/22 Pt will score 80% on FOTO in order to demonstrate improved perception of functional status due to symptoms.  Baseline: 74% Goal status: on going 07/15/2022  2.  Pt will demonstrate full thoracolumbar rotation AROM without pain in order to demonstrate improved tolerance to functional movement patterns.  Baseline: see ROM chart above Goal status: on going 08/17/2022  3.  Pt will demonstrate grossly symmetrical in order to demonstrate improved strength for functional movements.  Baseline: see MMT chart above Goal status: on going 08/17/2022 4. Pt will report ability to sit for up to 1 hour with less than 2/10 pain on NPS for improved tolerance to daily activities  Baseline: up to 8/10 pain with sitting for an hour  Goal status: on going 08/17/2022  5. Pt will be able to run for up to 3 miles with less than 3/10 thoracic pain on NPS in order to return to PLOF and maximize health/QOL.    Baseline: up to 8/10 pain w/ running 3 miles   Goal status: on going 08/17/2022  PLAN:  PT FREQUENCY: 2x/week  PT DURATION: 6 weeks  PLANNED INTERVENTIONS: Therapeutic  exercises, Therapeutic activity, Neuromuscular re-education, Balance training, Gait training, Patient/Family education, Self Care, Joint mobilization, Dry Needling, Electrical stimulation, Spinal mobilization, Cryotherapy, Moist heat, Taping, Manual therapy, and Re-evaluation. Above unless contraindicated.   PLAN FOR NEXT SESSION:, thoracic extension exercises, spinal mobs, progressive postural strengthening. DN PRN, likes heat at end.  Elsie Ra, PT, DPT 08/17/22 11:06 AM

## 2022-08-17 NOTE — Telephone Encounter (Signed)
I called patient this morning and did discuss recent thoracic MRI imaging. There is mild degenerative changes at T5-T6 and T7-T8. No evidence of high-grade spinal canal or neural foraminal stenosis. Overall imaging looks good good for age and does not seem to correlate with her symptoms. We would not recommend epidural steroid injection at this time. I instructed patient to continue with PT/dry needling. I do feel this is more myofascial pain, could also be fibromyalgia. We could look at medication management with Cymbalta. She will follow up with [place] as needed.

## 2022-08-20 ENCOUNTER — Encounter: Payer: Self-pay | Admitting: Physical Therapy

## 2022-08-20 ENCOUNTER — Ambulatory Visit: Payer: BC Managed Care – PPO | Admitting: Physical Therapy

## 2022-08-20 DIAGNOSIS — R293 Abnormal posture: Secondary | ICD-10-CM

## 2022-08-20 DIAGNOSIS — M6281 Muscle weakness (generalized): Secondary | ICD-10-CM | POA: Diagnosis not present

## 2022-08-20 DIAGNOSIS — M546 Pain in thoracic spine: Secondary | ICD-10-CM | POA: Diagnosis not present

## 2022-08-20 NOTE — Therapy (Signed)
OUTPATIENT PHYSICAL THERAPY TREATMENT   Patient Name: Shelley Armstrong MRN: [id number] DOB:1970/08/18, 52 y.o., female Today's Date: 08/20/2022  END OF SESSION:  PT End of Session - 08/20/22 0900     Visit Number 14    Number of Visits 21    Date for PT Re-Evaluation 09/28/22    Authorization Type BCBS    PT Start Time 0850    PT Stop Time 0938    PT Time Calculation (min) 48 min    Activity Tolerance Patient tolerated treatment well    Behavior During Therapy [facility] for tasks assessed/performed                   Past Medical History:  Diagnosis Date   Hypercholesterolemia    Past Surgical History:  Procedure Laterality Date   ROBOTIC ASSISTED LAP VAGINAL HYSTERECTOMY  11/2015   has ovaries   Patient Active Problem List   Diagnosis Date Noted   Greater trochanteric pain syndrome of left lower extremity 09/23/2021   Labral tear of hip, degenerative 09/15/2021   Patellofemoral pain syndrome of both knees 05/13/2021   Scapular dysfunction 08/13/2020   Right foot pain 06/11/2019   Back pain 04/21/2017   Low back pain radiating to right leg 02/15/2017   Hereditary and idiopathic peripheral neuropathy 04/19/2016   Long term use of drug 08/13/2014   Major depression, recurrent ([place]) 10/24/2013   Generalized anxiety disorder 02/27/2013   Hyperlipidemia 12/02/2012    PCP: Glendon Axe, MD  REFERRING PROVIDER: Lorine Bears, NP  REFERRING DIAG: M79.18 (ICD-10-CM) - Myofascial pain syndrome M54.9 (ICD-10-CM) - Upper back pain M54.6,G89.29 (ICD-10-CM) - Chronic bilateral thoracic back pain  Rationale for Evaluation and Treatment: Rehabilitation  THERAPY DIAG:  Pain in thoracic spine  Abnormal posture  Muscle weakness (generalized)  ONSET DATE: a couple months ago, sudden onset   SUBJECTIVE:                                                                                                                                                                                            SUBJECTIVE STATEMENT: She reports she has some pain after a 3 hour trip yesterday.   PERTINENT HISTORY:  anxiety/depression, multiple MSK issues in past   PAIN:  Are you having pain: 5/10 Location: Lt middle  How would you describe your pain? Aching pain typically, occasional sharp pain Aggravating factors: cold, prolonged sitting Easing factors: heating pad, medication, repositioning   PRECAUTIONS: None  WEIGHT BEARING RESTRICTIONS: No  FALLS:  Has patient fallen in last 6 months? No  LIVING ENVIRONMENT: With spouse and cat, 1 story, no STE  OCCUPATION: works at [employer], has to lift heavy boxes of books  PLOF: Independent  PATIENT GOALS: reduce pain  OBJECTIVE:   DIAGNOSTIC FINDINGS:  08/17/22 Thoracic MRI IMPRESSION: Mild degenerative disc disease at T5-T6 and T7-T8. No evidence of high-grade spinal canal or neural foraminal stenosis. There is a small disc protrusion at T5-T6. There is a small disc bulge at T7-T8.  PATIENT SURVEYS:   06/25/2022 FOTO 74% predicted 80%  SCREENING FOR RED FLAGS: 06/25/2022 Unremarkable for red flags  COGNITION: 06/25/2022 Overall cognitive status: Within functional limits for tasks assessed     SENSATION: 06/25/2022 Light touch intact all extremities   POSTURE:  06/25/2022 rounded shoulders, forward head, and increased thoracic kyphosis  PALPATION: 07/09/2022:  Trigger points c concordant symptoms in Lt mid thoracic paraspinals, Lt infraspinatus.   06/25/2022 TTP B middle/lower traps, rhomboids, lats. R infraspinatus/teres. Trigger points throughout Mild stiffness with grade 2-3 PA mobs throughout lower T spine, no overt pain, pt reports mild relief  Thoracic ROM:   Active  AROM  eval  Flexion   Extension   Right lateral flexion   Left lateral flexion   Right rotation   Left rotation    (Blank rows = not tested)  Cervical ROM grossly WNL all planes, painless   LUMBAR ROM:   AROM  06/25/2022  Flexion 100% painless (distal shin)  Extension 100%  Right lateral flexion   Left lateral flexion   Right rotation 100% *  Left rotation 100% *    (Blank rows = not tested) Comments: increased stiffness and reduction in rotation B w/ hips fixed compared to free hips, no change in pain  UPPER EXTREMITY ROM:  A/PROM Right eval Left eval  Shoulder flexion    Shoulder abduction    Shoulder internal rotation    Shoulder external rotation    Elbow flexion    Elbow extension    Wrist flexion    Wrist extension     (Blank rows = not tested) (Key: WFL = within functional limits not formally assessed, * = concordant pain, s = stiffness/stretching sensation, NT = not tested)  Comments:  06/25/2022 GH ROM grossly WNL flexion and abduction, painless   UPPER EXTREMITY MMT:  MMT Right 06/25/2022 Left 06/25/2022 Right 07/15/2022 Left 07/15/2022  Shoulder flexion 5 5 5/5 5/5  Shoulder extension      Shoulder abduction 5 5 5/5 5/55  Shoulder extension      Shoulder internal rotation 5 5 5/5 5/5  Shoulder external rotation 4* 4+ 5/5 5/5  Elbow flexion      Elbow extension      Grip strength      (Blank rows = not tested)  (Key: WFL = within functional limits not formally assessed, * = concordant pain, s = stiffness/stretching sensation, NT = not tested)  Comments: of note, with HEP pt demonstrates notably reduced control of periscapular musculature with frequent compensations at thoracolumbar spine   GAIT: 06/25/2022 Distance walked: within clinic Assistive device utilized: None Level of assistance: Complete Independence Comments: WNL   TODAY'S TREATMENT:  DATE: 08/20/2022 Therapeutic Exercise:  Nu step L6 X 6 min UE/LE Rows and shoulder extensions green 2X15 each Standing lumbar-thoracic extensions SNAG with strap 3 sec 2X10 Seated thoracic extension over  chair back 3 sec 2 X 10 Quadriped cat-camel 5 sec 2 X 10 each Sidelying thoracic rotations 5 sec X 10 Prone Y, T, I, 3 sec each X 10 bilat   Manual Therapy: Central PA mobs T4-8 grade 2 in prone Manual therapy for skilled palpation and active tissue compression   Trigger Point Dry-Needling  Treatment instructions: Expect mild to moderate muscle soreness. Patient Consent Given: Yes Education handout provided: Previously Muscles treated: left infraspinatus Treatment response/outcome: good overall tolerance,twitch response noted    Moist heat to cervical/shoulder region in prone X 8 min, not included in treatment time.  DATE: 08/17/2022 Therapeutic Exercise:  UBE L3 2.5 min fwd, 2.5 min retro Standing lumbar-thoracic extensions SNAG with strap 3 sec 2X10 Seated thoracic extension over chair back 3 sec X 10 Quadriped cat-camel 5 sec X 10 each Sidelying thoracic rotations 5 sec X 10 Prone Y, T, I, 3 sec each X 10 bilat   Manual Therapy: Central PA mobs T4-8 grade 2 in prone Manual therapy for skilled palpation and active tissue compression   Trigger Point Dry-Needling  Treatment instructions: Expect mild to moderate muscle soreness. Patient Consent Given: Yes Education handout provided: Previously Muscles treated: thoracic and cervical paraspinals and multifidi Treatment response/outcome: good overall tolerance,twitch response noted    Moist heat to cervical/shoulder region in prone X 8 min, not included in treatment time.    PATIENT EDUCATION:  Education details: Pt education on PT impairments, prognosis, and POC. Informed consent. Rationale for interventions, safe/appropriate HEP performance Person educated: Patient Education method: Explanation, Demonstration, Tactile cues, Verbal cues, and Handouts Education comprehension: verbalized understanding, returned demonstration, verbal cues required, tactile cues required, and needs further education    HOME EXERCISE  PROGRAM: Access Code: [email or web address] URL: [email or web address] Date: 08/17/2022 Prepared by: Elsie Ra  Exercises - Standing Lumbar Extension  - 2 x daily - 6 x weekly - 1-2 sets - 10 reps - 3 sec hold - Seated Thoracic Lumbar Extension with Pectoralis Stretch  - 2 x daily - 6 x weekly - 1-2 sets - 10 reps - 3 sec hold - Cat Cow  - 2 x daily - 6 x weekly - 1 sets - 10 reps - 5 hold - Prone Scapular Slide with Shoulder Extension  - 2 x daily - 7 x weekly - 1 sets - 10 reps - 3 sec hold - Prone Scapular Retraction Arms at Side  - 2 x daily - 6 x weekly - 1 sets - 10 reps - 3 sec hold - Prone Scapular Retraction Y  - 2 x daily - 6 x weekly - 1 sets - 10 reps - 3 sec hold - Shoulder External Rotation and Scapular Retraction with Resistance  - 1-2 x daily - 7 x weekly - 2-3 sets - 10 reps - Standing Row with Anchored Resistance  - 2 x daily - 6 x weekly - 2-3 sets - 10-20 reps - Shoulder extension with resistance - Neutral  - 2 x daily - 6 x weekly - 2-3 sets - 10 reps  ASSESSMENT:  CLINICAL IMPRESSION:  We reviewed and had her perform her newer HEP exercises and she shows good understanding of these. She did have some pain and trigger points in left infraspinatus so performed DN for this along with spinal mobs  to help with her pain and posture.  OBJECTIVE IMPAIRMENTS: decreased activity tolerance, decreased mobility, decreased strength, increased muscle spasms, impaired UE functional use, postural dysfunction, and pain.   ACTIVITY LIMITATIONS: carrying, lifting, bending, sitting, reach over head, and locomotion level  PARTICIPATION LIMITATIONS: community activity, occupation, and recreational exercise  PERSONAL FACTORS: Time since onset of injury/illness/exacerbation and 1 comorbidity: anxiety/depression  are also affecting patient's functional outcome.   REHAB POTENTIAL: Good  CLINICAL DECISION MAKING: Stable/uncomplicated  EVALUATION COMPLEXITY: Low   GOALS: Goals  reviewed with patient? No  SHORT TERM GOALS: Target date: 07/16/2022 Pt will demonstrate appropriate understanding and performance of initially prescribed HEP in order to facilitate improved independence with management of symptoms.  Baseline: HEP provided on eval Goal status: Met 07/15/2022   LONG TERM GOALS: Target date: 09/28/22 Pt will score 80% on FOTO in order to demonstrate improved perception of functional status due to symptoms.  Baseline: 74% Goal status: on going 07/15/2022  2.  Pt will demonstrate full thoracolumbar rotation AROM without pain in order to demonstrate improved tolerance to functional movement patterns.  Baseline: see ROM chart above Goal status: on going 08/17/2022  3.  Pt will demonstrate grossly symmetrical in order to demonstrate improved strength for functional movements.  Baseline: see MMT chart above Goal status: on going 08/17/2022 4. Pt will report ability to sit for up to 1 hour with less than 2/10 pain on NPS for improved tolerance to daily activities  Baseline: up to 8/10 pain with sitting for an hour  Goal status: on going 08/17/2022  5. Pt will be able to run for up to 3 miles with less than 3/10 thoracic pain on NPS in order to return to PLOF and maximize health/QOL.    Baseline: up to 8/10 pain w/ running 3 miles   Goal status: on going 08/17/2022  PLAN:  PT FREQUENCY: 2x/week  PT DURATION: 6 weeks  PLANNED INTERVENTIONS: Therapeutic exercises, Therapeutic activity, Neuromuscular re-education, Balance training, Gait training, Patient/Family education, Self Care, Joint mobilization, Dry Needling, Electrical stimulation, Spinal mobilization, Cryotherapy, Moist heat, Taping, Manual therapy, and Re-evaluation. Above unless contraindicated.   PLAN FOR NEXT SESSION:, thoracic extension exercises, spinal mobs, progressive postural strengthening. DN PRN, likes heat at end.  Elsie Ra, PT, DPT 08/20/22 9:32 AM

## 2022-08-25 ENCOUNTER — Encounter: Payer: BC Managed Care – PPO | Admitting: Physical Therapy

## 2022-08-26 DIAGNOSIS — J018 Other acute sinusitis: Secondary | ICD-10-CM | POA: Diagnosis not present

## 2022-08-26 DIAGNOSIS — E7800 Pure hypercholesterolemia, unspecified: Secondary | ICD-10-CM | POA: Diagnosis not present

## 2022-08-26 DIAGNOSIS — Z6822 Body mass index (BMI) 22.0-22.9, adult: Secondary | ICD-10-CM | POA: Diagnosis not present

## 2022-08-26 DIAGNOSIS — R7303 Prediabetes: Secondary | ICD-10-CM | POA: Diagnosis not present

## 2022-08-26 DIAGNOSIS — I10 Essential (primary) hypertension: Secondary | ICD-10-CM | POA: Diagnosis not present

## 2022-08-27 ENCOUNTER — Encounter: Payer: BC Managed Care – PPO | Admitting: Physical Therapy

## 2022-09-01 ENCOUNTER — Encounter: Payer: BC Managed Care – PPO | Admitting: Physical Therapy

## 2022-09-05 DIAGNOSIS — J029 Acute pharyngitis, unspecified: Secondary | ICD-10-CM | POA: Diagnosis not present

## 2022-09-05 DIAGNOSIS — J3489 Other specified disorders of nose and nasal sinuses: Secondary | ICD-10-CM | POA: Diagnosis not present

## 2022-09-08 ENCOUNTER — Ambulatory Visit: Payer: BC Managed Care – PPO | Admitting: Physical Therapy

## 2022-09-08 ENCOUNTER — Encounter: Payer: Self-pay | Admitting: Physical Therapy

## 2022-09-08 DIAGNOSIS — M6281 Muscle weakness (generalized): Secondary | ICD-10-CM | POA: Diagnosis not present

## 2022-09-08 DIAGNOSIS — M546 Pain in thoracic spine: Secondary | ICD-10-CM

## 2022-09-08 DIAGNOSIS — R293 Abnormal posture: Secondary | ICD-10-CM | POA: Diagnosis not present

## 2022-09-08 NOTE — Therapy (Signed)
OUTPATIENT PHYSICAL THERAPY TREATMENT   Patient Name: Shelley Armstrong MRN: [id number] DOB:1970-10-10, 52 y.o., female Today's Date: 09/08/2022  END OF SESSION:  PT End of Session - 09/08/22 1436     Visit Number 15    Number of Visits 21    Date for PT Re-Evaluation 09/28/22    Authorization Type [employer]    PT Start Time 1435    PT Stop Time 1523    PT Time Calculation (min) 48 min    Activity Tolerance Patient tolerated treatment well    Behavior During Therapy WFL for tasks assessed/performed                    Past Medical History:  Diagnosis Date   Hypercholesterolemia    Past Surgical History:  Procedure Laterality Date   ROBOTIC ASSISTED LAP VAGINAL HYSTERECTOMY  11/2015   has ovaries   Patient Active Problem List   Diagnosis Date Noted   Greater trochanteric pain syndrome of left lower extremity 09/23/2021   Labral tear of hip, degenerative 09/15/2021   Patellofemoral pain syndrome of both knees 05/13/2021   Scapular dysfunction 08/13/2020   Right foot pain 06/11/2019   Back pain 04/21/2017   Low back pain radiating to right leg 02/15/2017   Hereditary and idiopathic peripheral neuropathy 04/19/2016   Long term use of drug 08/13/2014   Major depression, recurrent ([place]) 10/24/2013   Generalized anxiety disorder 02/27/2013   Hyperlipidemia 12/02/2012    PCP: Glendon Axe, MD  REFERRING PROVIDER: Lorine Bears, NP  REFERRING DIAG: M79.18 (ICD-10-CM) - Myofascial pain syndrome M54.9 (ICD-10-CM) - Upper back pain M54.6,G89.29 (ICD-10-CM) - Chronic bilateral thoracic back pain  Rationale for Evaluation and Treatment: Rehabilitation  THERAPY DIAG:  Pain in thoracic spine  Abnormal posture  Muscle weakness (generalized)  ONSET DATE: a couple months ago, sudden onset   SUBJECTIVE:                                                                                                                                                                                            SUBJECTIVE STATEMENT: Has been on prednisone for a sinus infection; overall feeling much better.  PERTINENT HISTORY:  anxiety/depression, multiple MSK issues in past   PAIN:  Are you having pain: 0/10 Location: Lt middle  How would you describe your pain? Aching pain typically, occasional sharp pain Aggravating factors: cold, prolonged sitting Easing factors: heating pad, medication, repositioning   PRECAUTIONS: None  WEIGHT BEARING RESTRICTIONS: No  FALLS:  Has patient fallen in last 6 months? No  LIVING ENVIRONMENT: With spouse and cat, 1 story, no STE  OCCUPATION: works at [employer], has to lift heavy boxes of books  PLOF: Independent  PATIENT GOALS: reduce pain  OBJECTIVE:   DIAGNOSTIC FINDINGS:  08/17/22 Thoracic MRI IMPRESSION: Mild degenerative disc disease at T5-T6 and T7-T8. No evidence of high-grade spinal canal or neural foraminal stenosis. There is a small disc protrusion at T5-T6. There is a small disc bulge at T7-T8.  PATIENT SURVEYS:  09/08/22: FOTO 67 (pt reports she did not base on reason for visit soley)  06/25/2022 FOTO 74% predicted 80%  POSTURE:  06/25/2022 rounded shoulders, forward head, and increased thoracic kyphosis  PALPATION: 07/09/2022:  Trigger points c concordant symptoms in Lt mid thoracic paraspinals, Lt infraspinatus.   06/25/2022 TTP B middle/lower traps, rhomboids, lats. R infraspinatus/teres. Trigger points throughout Mild stiffness with grade 2-3 PA mobs throughout lower T spine, no overt pain, pt reports mild relief  Thoracic ROM:   Active  AROM  eval  Flexion   Extension   Right lateral flexion   Left lateral flexion   Right rotation   Left rotation    (Blank rows = not tested)  Cervical ROM grossly WNL all planes, painless   LUMBAR ROM:   AROM 06/25/2022  Flexion 100% painless (distal shin)  Extension 100%  Right lateral flexion   Left lateral flexion   Right rotation 100% *  Left  rotation 100% *    (Blank rows = not tested) Comments: increased stiffness and reduction in rotation B w/ hips fixed compared to free hips, no change in pain  UPPER EXTREMITY ROM:  A/PROM Right eval Left eval  Shoulder flexion    Shoulder abduction    Shoulder internal rotation    Shoulder external rotation    Elbow flexion    Elbow extension    Wrist flexion    Wrist extension     (Blank rows = not tested) (Key: WFL = within functional limits not formally assessed, * = concordant pain, s = stiffness/stretching sensation, NT = not tested)  Comments:  06/25/2022 GH ROM grossly WNL flexion and abduction, painless   UPPER EXTREMITY MMT:  MMT Right 06/25/2022 Left 06/25/2022 Right 07/15/2022 Left 07/15/2022  Shoulder flexion 5 5 5/5 5/5  Shoulder extension      Shoulder abduction 5 5 5/5 5/55  Shoulder extension      Shoulder internal rotation 5 5 5/5 5/5  Shoulder external rotation 4* 4+ 5/5 5/5  Elbow flexion      Elbow extension      Grip strength      (Blank rows = not tested)  (Key: WFL = within functional limits not formally assessed, * = concordant pain, s = stiffness/stretching sensation, NT = not tested)  Comments: of note, with HEP pt demonstrates notably reduced control of periscapular musculature with frequent compensations at thoracolumbar spine   GAIT: 06/25/2022 Distance walked: within clinic Assistive device utilized: None Level of assistance: Complete Independence Comments: WNL   TODAY'S TREATMENT:  DATE: 09/08/2022 Therapeutic Exercise:  NuStep L5 x 8 min UE/LE Rows and extension L3 band 2x15; 5 sec hold Bicep curls to overhead press 2x15; 2# weights Bent over reverse fly 2x15; 2# weights Flexion --> horizontal abduction --> adduction; then reverse 2x15; 2# weights Quadruped cat/cow x 10 reps Quadruped hip extension 2x10 bil Sidelying book  openers 10 x 5 sec hold bil Bridges with chest press with 2# weights 2x10     Moist heat to cervical/shoulder region in prone X 10 min, not included in treatment time.   DATE: 08/20/2022 Therapeutic Exercise:  Nu step L6 X 6 min UE/LE Rows and shoulder extensions green 2X15 each Standing lumbar-thoracic extensions SNAG with strap 3 sec 2X10 Seated thoracic extension over chair back 3 sec 2 X 10 Quadriped cat-camel 5 sec 2 X 10 each Sidelying thoracic rotations 5 sec X 10 Prone Y, T, I, 3 sec each X 10 bilat   Manual Therapy: Central PA mobs T4-8 grade 2 in prone Manual therapy for skilled palpation and active tissue compression   Trigger Point Dry-Needling  Treatment instructions: Expect mild to moderate muscle soreness. Patient Consent Given: Yes Education handout provided: Previously Muscles treated: left infraspinatus Treatment response/outcome: good overall tolerance,twitch response noted    Moist heat to cervical/shoulder region in prone X 8 min, not included in treatment time.  DATE: 08/17/2022 Therapeutic Exercise:  UBE L3 2.5 min fwd, 2.5 min retro Standing lumbar-thoracic extensions SNAG with strap 3 sec 2X10 Seated thoracic extension over chair back 3 sec X 10 Quadriped cat-camel 5 sec X 10 each Sidelying thoracic rotations 5 sec X 10 Prone Y, T, I, 3 sec each X 10 bilat   Manual Therapy: Central PA mobs T4-8 grade 2 in prone Manual therapy for skilled palpation and active tissue compression   Trigger Point Dry-Needling  Treatment instructions: Expect mild to moderate muscle soreness. Patient Consent Given: Yes Education handout provided: Previously Muscles treated: thoracic and cervical paraspinals and multifidi Treatment response/outcome: good overall tolerance,twitch response noted    Moist heat to cervical/shoulder region in prone X 8 min, not included in treatment time.    PATIENT EDUCATION:  Education details: Pt education on PT impairments,  prognosis, and POC. Informed consent. Rationale for interventions, safe/appropriate HEP performance Person educated: Patient Education method: Explanation, Demonstration, Tactile cues, Verbal cues, and Handouts Education comprehension: verbalized understanding, returned demonstration, verbal cues required, tactile cues required, and needs further education    HOME EXERCISE PROGRAM: Access Code: [email or web address] URL: [email or web address] Date: 08/17/2022 Prepared by: Elsie Ra  Exercises - Standing Lumbar Extension  - 2 x daily - 6 x weekly - 1-2 sets - 10 reps - 3 sec hold - Seated Thoracic Lumbar Extension with Pectoralis Stretch  - 2 x daily - 6 x weekly - 1-2 sets - 10 reps - 3 sec hold - Cat Cow  - 2 x daily - 6 x weekly - 1 sets - 10 reps - 5 hold - Prone Scapular Slide with Shoulder Extension  - 2 x daily - 7 x weekly - 1 sets - 10 reps - 3 sec hold - Prone Scapular Retraction Arms at Side  - 2 x daily - 6 x weekly - 1 sets - 10 reps - 3 sec hold - Prone Scapular Retraction Y  - 2 x daily - 6 x weekly - 1 sets - 10 reps - 3 sec hold - Shoulder External Rotation and Scapular Retraction with Resistance  - 1-2 x daily - 7  x weekly - 2-3 sets - 10 reps - Standing Row with Anchored Resistance  - 2 x daily - 6 x weekly - 2-3 sets - 10-20 reps - Shoulder extension with resistance - Neutral  - 2 x daily - 6 x weekly - 2-3 sets - 10 reps  ASSESSMENT:  CLINICAL IMPRESSION:  Pt tolerated session well today with focus on strengthening today.  Overall progressing well with PT and pain minimized today.  FOTO score decreased but pt reports she's improving overall.  OBJECTIVE IMPAIRMENTS: decreased activity tolerance, decreased mobility, decreased strength, increased muscle spasms, impaired UE functional use, postural dysfunction, and pain.   ACTIVITY LIMITATIONS: carrying, lifting, bending, sitting, reach over head, and locomotion level  PARTICIPATION LIMITATIONS: community activity,  occupation, and recreational exercise  PERSONAL FACTORS: Time since onset of injury/illness/exacerbation and 1 comorbidity: anxiety/depression  are also affecting patient's functional outcome.   REHAB POTENTIAL: Good  CLINICAL DECISION MAKING: Stable/uncomplicated  EVALUATION COMPLEXITY: Low   GOALS: Goals reviewed with patient? No  SHORT TERM GOALS: Target date: 07/16/2022 Pt will demonstrate appropriate understanding and performance of initially prescribed HEP in order to facilitate improved independence with management of symptoms.  Baseline: HEP provided on eval Goal status: Met 07/15/2022   LONG TERM GOALS: Target date: 09/28/22 Pt will score 80% on FOTO in order to demonstrate improved perception of functional status due to symptoms.  Baseline: 74% Goal status: on going 07/15/2022  2.  Pt will demonstrate full thoracolumbar rotation AROM without pain in order to demonstrate improved tolerance to functional movement patterns.  Baseline: see ROM chart above Goal status: on going 08/17/2022  3.  Pt will demonstrate grossly symmetrical in order to demonstrate improved strength for functional movements.  Baseline: see MMT chart above Goal status: on going 08/17/2022 4. Pt will report ability to sit for up to 1 hour with less than 2/10 pain on NPS for improved tolerance to daily activities  Baseline: up to 8/10 pain with sitting for an hour  Goal status: on going 08/17/2022  5. Pt will be able to run for up to 3 miles with less than 3/10 thoracic pain on NPS in order to return to PLOF and maximize health/QOL.    Baseline: up to 8/10 pain w/ running 3 miles   Goal status: on going 08/17/2022  PLAN:  PT FREQUENCY: 2x/week  PT DURATION: 6 weeks  PLANNED INTERVENTIONS: Therapeutic exercises, Therapeutic activity, Neuromuscular re-education, Balance training, Gait training, Patient/Family education, Self Care, Joint mobilization, Dry Needling, Electrical stimulation, Spinal  mobilization, Cryotherapy, Moist heat, Taping, Manual therapy, and Re-evaluation. Above unless contraindicated.   PLAN FOR NEXT SESSION:  strengthening exercises;  thoracic extension exercises, spinal mobs, progressive postural strengthening. DN PRN, likes heat at end.    Laureen Abrahams, PT, DPT 09/08/22 3:17 PM

## 2022-09-10 ENCOUNTER — Encounter: Payer: Self-pay | Admitting: Physical Therapy

## 2022-09-10 ENCOUNTER — Ambulatory Visit: Payer: BC Managed Care – PPO | Admitting: Physical Therapy

## 2022-09-10 DIAGNOSIS — M6281 Muscle weakness (generalized): Secondary | ICD-10-CM | POA: Diagnosis not present

## 2022-09-10 DIAGNOSIS — R293 Abnormal posture: Secondary | ICD-10-CM

## 2022-09-10 DIAGNOSIS — M546 Pain in thoracic spine: Secondary | ICD-10-CM | POA: Diagnosis not present

## 2022-09-10 NOTE — Therapy (Signed)
OUTPATIENT PHYSICAL THERAPY TREATMENT   Patient Name: Shelley Armstrong MRN: [id number] DOB:10/07/1970, 52 y.o., female Today's Date: 09/10/2022  END OF SESSION:  PT End of Session - 09/10/22 0931     Visit Number 16    Number of Visits 21    Date for PT Re-Evaluation 09/28/22    Authorization Type [employer]    PT Start Time 0929    PT Stop Time 1018   moist heat   PT Time Calculation (min) 49 min    Activity Tolerance Patient tolerated treatment well    Behavior During Therapy [facility] for tasks assessed/performed                     Past Medical History:  Diagnosis Date   Hypercholesterolemia    Past Surgical History:  Procedure Laterality Date   ROBOTIC ASSISTED LAP VAGINAL HYSTERECTOMY  11/2015   has ovaries   Patient Active Problem List   Diagnosis Date Noted   Greater trochanteric pain syndrome of left lower extremity 09/23/2021   Labral tear of hip, degenerative 09/15/2021   Patellofemoral pain syndrome of both knees 05/13/2021   Scapular dysfunction 08/13/2020   Right foot pain 06/11/2019   Back pain 04/21/2017   Low back pain radiating to right leg 02/15/2017   Hereditary and idiopathic peripheral neuropathy 04/19/2016   Long term use of drug 08/13/2014   Major depression, recurrent ([place]) 10/24/2013   Generalized anxiety disorder 02/27/2013   Hyperlipidemia 12/02/2012    PCP: Glendon Axe, MD  REFERRING PROVIDER: Lorine Bears, NP  REFERRING DIAG: M79.18 (ICD-10-CM) - Myofascial pain syndrome M54.9 (ICD-10-CM) - Upper back pain M54.6,G89.29 (ICD-10-CM) - Chronic bilateral thoracic back pain  Rationale for Evaluation and Treatment: Rehabilitation  THERAPY DIAG:  Pain in thoracic spine  Abnormal posture  Muscle weakness (generalized)  ONSET DATE: a couple months ago, sudden onset   SUBJECTIVE:                                                                                                                                                                                            SUBJECTIVE STATEMENT: Doing well; wasn't too sore after last session May want some dry needling today  PERTINENT HISTORY:  anxiety/depression, multiple MSK issues in past   PAIN:  Are you having pain: 0/10 Location: Lt middle  How would you describe your pain? Aching pain typically, occasional sharp pain Aggravating factors: cold, prolonged sitting Easing factors: heating pad, medication, repositioning   PRECAUTIONS: None  WEIGHT BEARING RESTRICTIONS: No  FALLS:  Has patient fallen in last 6 months? No  LIVING ENVIRONMENT: With spouse and  cat, 1 story, no STE  OCCUPATION: works at [employer], has to lift heavy boxes of books  PLOF: Independent  PATIENT GOALS: reduce pain  OBJECTIVE:   DIAGNOSTIC FINDINGS:  08/17/22 Thoracic MRI IMPRESSION: Mild degenerative disc disease at T5-T6 and T7-T8. No evidence of high-grade spinal canal or neural foraminal stenosis. There is a small disc protrusion at T5-T6. There is a small disc bulge at T7-T8.  PATIENT SURVEYS:  09/08/22: FOTO 67 (pt reports she did not base on reason for visit soley)  06/25/2022 FOTO 74% predicted 80%  POSTURE:  06/25/2022 rounded shoulders, forward head, and increased thoracic kyphosis  PALPATION: 07/09/2022:  Trigger points c concordant symptoms in Lt mid thoracic paraspinals, Lt infraspinatus.   06/25/2022 TTP B middle/lower traps, rhomboids, lats. R infraspinatus/teres. Trigger points throughout Mild stiffness with grade 2-3 PA mobs throughout lower T spine, no overt pain, pt reports mild relief  Thoracic ROM:   Active  AROM  eval  Flexion   Extension   Right lateral flexion   Left lateral flexion   Right rotation   Left rotation    (Blank rows = not tested)  Cervical ROM grossly WNL all planes, painless   LUMBAR ROM:   AROM 06/25/2022  Flexion 100% painless (distal shin)  Extension 100%  Right lateral flexion   Left lateral flexion   Right  rotation 100% *  Left rotation 100% *    (Blank rows = not tested) Comments: increased stiffness and reduction in rotation B w/ hips fixed compared to free hips, no change in pain  UPPER EXTREMITY ROM:  A/PROM Right eval Left eval  Shoulder flexion    Shoulder abduction    Shoulder internal rotation    Shoulder external rotation    Elbow flexion    Elbow extension    Wrist flexion    Wrist extension     (Blank rows = not tested) (Key: WFL = within functional limits not formally assessed, * = concordant pain, s = stiffness/stretching sensation, NT = not tested)  Comments:  06/25/2022 GH ROM grossly WNL flexion and abduction, painless   UPPER EXTREMITY MMT:  MMT Right 06/25/2022 Left 06/25/2022 Right 07/15/2022 Left 07/15/2022  Shoulder flexion 5 5 5/5 5/5  Shoulder extension      Shoulder abduction 5 5 5/5 5/55  Shoulder extension      Shoulder internal rotation 5 5 5/5 5/5  Shoulder external rotation 4* 4+ 5/5 5/5  Elbow flexion      Elbow extension      Grip strength      (Blank rows = not tested)  (Key: WFL = within functional limits not formally assessed, * = concordant pain, s = stiffness/stretching sensation, NT = not tested)  Comments: of note, with HEP pt demonstrates notably reduced control of periscapular musculature with frequent compensations at thoracolumbar spine   GAIT: 06/25/2022 Distance walked: within clinic Assistive device utilized: None Level of assistance: Complete Independence Comments: WNL   TODAY'S TREATMENT: DATE: 09/10/2022 Therapeutic Exercise:  NuStep L5 x 8 min UE/LE Rows and extension L4 band 2x15; 5 sec hold BATCA rows 20# 2x10 BATCA lat pull downs 20# 2x10 Quadruped alternating rows 5# dumbbells 2x10 bil Quadruped/partial plank on knees: thread the needle for thoracic rotation x 10 reps bil   Manual Therapy: STM with compression to bil infraspinatus Manual therapy for skilled palpation and active tissue compression    Trigger Point Dry-Needling  Treatment instructions: Expect mild to moderate muscle soreness. Patient Consent Given:  Yes Education handout provided: Previously Muscles treated: bil infraspinatus Treatment response/outcome: good overall tolerance,twitch response noted    Modalities  Moist heat to cervical/shoulder region in prone X 10 min, not included in treatment time.   DATE: 09/08/2022 Therapeutic Exercise:  NuStep L5 x 8 min UE/LE Rows and extension L3 band 2x15; 5 sec hold Bicep curls to overhead press 2x15; 2# weights Bent over reverse fly 2x15; 2# weights Flexion --> horizontal abduction --> adduction; then reverse 2x15; 2# weights Quadruped cat/cow x 10 reps Quadruped hip extension 2x10 bil Sidelying book openers 10 x 5 sec hold bil Bridges with chest press with 2# weights 2x10     Moist heat to cervical/shoulder region in prone X 10 min, not included in treatment time.   DATE: 08/20/2022 Therapeutic Exercise:  Nu step L6 X 6 min UE/LE Rows and shoulder extensions green 2X15 each Standing lumbar-thoracic extensions SNAG with strap 3 sec 2X10 Seated thoracic extension over chair back 3 sec 2 X 10 Quadriped cat-camel 5 sec 2 X 10 each Sidelying thoracic rotations 5 sec X 10 Prone Y, T, I, 3 sec each X 10 bilat   Manual Therapy: Central PA mobs T4-8 grade 2 in prone Manual therapy for skilled palpation and active tissue compression   Trigger Point Dry-Needling  Treatment instructions: Expect mild to moderate muscle soreness. Patient Consent Given: Yes Education handout provided: Previously Muscles treated: left infraspinatus Treatment response/outcome: good overall tolerance,twitch response noted    Moist heat to cervical/shoulder region in prone X 8 min, not included in treatment time.  DATE: 08/17/2022 Therapeutic Exercise:  UBE L3 2.5 min fwd, 2.5 min retro Standing lumbar-thoracic extensions SNAG with strap 3 sec 2X10 Seated thoracic extension over chair  back 3 sec X 10 Quadriped cat-camel 5 sec X 10 each Sidelying thoracic rotations 5 sec X 10 Prone Y, T, I, 3 sec each X 10 bilat   Manual Therapy: Central PA mobs T4-8 grade 2 in prone Manual therapy for skilled palpation and active tissue compression   Trigger Point Dry-Needling  Treatment instructions: Expect mild to moderate muscle soreness. Patient Consent Given: Yes Education handout provided: Previously Muscles treated: thoracic and cervical paraspinals and multifidi Treatment response/outcome: good overall tolerance,twitch response noted    Moist heat to cervical/shoulder region in prone X 8 min, not included in treatment time.    PATIENT EDUCATION:  Education details: Pt education on PT impairments, prognosis, and POC. Informed consent. Rationale for interventions, safe/appropriate HEP performance Person educated: Patient Education method: Explanation, Demonstration, Tactile cues, Verbal cues, and Handouts Education comprehension: verbalized understanding, returned demonstration, verbal cues required, tactile cues required, and needs further education    HOME EXERCISE PROGRAM: Access Code: [email or web address] URL: [email or web address] Date: 08/17/2022 Prepared by: Elsie Ra  Exercises - Standing Lumbar Extension  - 2 x daily - 6 x weekly - 1-2 sets - 10 reps - 3 sec hold - Seated Thoracic Lumbar Extension with Pectoralis Stretch  - 2 x daily - 6 x weekly - 1-2 sets - 10 reps - 3 sec hold - Cat Cow  - 2 x daily - 6 x weekly - 1 sets - 10 reps - 5 hold - Prone Scapular Slide with Shoulder Extension  - 2 x daily - 7 x weekly - 1 sets - 10 reps - 3 sec hold - Prone Scapular Retraction Arms at Side  - 2 x daily - 6 x weekly - 1 sets - 10 reps - 3 sec hold - Prone Scapular  Retraction Y  - 2 x daily - 6 x weekly - 1 sets - 10 reps - 3 sec hold - Shoulder External Rotation and Scapular Retraction with Resistance  - 1-2 x daily - 7 x weekly - 2-3 sets - 10 reps -  Standing Row with Anchored Resistance  - 2 x daily - 6 x weekly - 2-3 sets - 10-20 reps - Shoulder extension with resistance - Neutral  - 2 x daily - 6 x weekly - 2-3 sets - 10 reps  ASSESSMENT:  CLINICAL IMPRESSION:  Pt doing well with PT and able to continue to progress strengthening exercises.  Good response to DN today.  Will continue to benefit from PT to maximize function.  OBJECTIVE IMPAIRMENTS: decreased activity tolerance, decreased mobility, decreased strength, increased muscle spasms, impaired UE functional use, postural dysfunction, and pain.   ACTIVITY LIMITATIONS: carrying, lifting, bending, sitting, reach over head, and locomotion level  PARTICIPATION LIMITATIONS: community activity, occupation, and recreational exercise  PERSONAL FACTORS: Time since onset of injury/illness/exacerbation and 1 comorbidity: anxiety/depression  are also affecting patient's functional outcome.   REHAB POTENTIAL: Good  CLINICAL DECISION MAKING: Stable/uncomplicated  EVALUATION COMPLEXITY: Low   GOALS: Goals reviewed with patient? No  SHORT TERM GOALS: Target date: 07/16/2022 Pt will demonstrate appropriate understanding and performance of initially prescribed HEP in order to facilitate improved independence with management of symptoms.  Baseline: HEP provided on eval Goal status: Met 07/15/2022   LONG TERM GOALS: Target date: 09/28/22 Pt will score 80% on FOTO in order to demonstrate improved perception of functional status due to symptoms.  Baseline: 74% Goal status: on going 07/15/2022  2.  Pt will demonstrate full thoracolumbar rotation AROM without pain in order to demonstrate improved tolerance to functional movement patterns.  Baseline: see ROM chart above Goal status: on going 08/17/2022  3.  Pt will demonstrate grossly symmetrical in order to demonstrate improved strength for functional movements.  Baseline: see MMT chart above Goal status: on going 08/17/2022 4. Pt will report  ability to sit for up to 1 hour with less than 2/10 pain on NPS for improved tolerance to daily activities  Baseline: up to 8/10 pain with sitting for an hour  Goal status: on going 08/17/2022  5. Pt will be able to run for up to 3 miles with less than 3/10 thoracic pain on NPS in order to return to PLOF and maximize health/QOL.    Baseline: up to 8/10 pain w/ running 3 miles   Goal status: on going 08/17/2022  PLAN:  PT FREQUENCY: 2x/week  PT DURATION: 6 weeks  PLANNED INTERVENTIONS: Therapeutic exercises, Therapeutic activity, Neuromuscular re-education, Balance training, Gait training, Patient/Family education, Self Care, Joint mobilization, Dry Needling, Electrical stimulation, Spinal mobilization, Cryotherapy, Moist heat, Taping, Manual therapy, and Re-evaluation. Above unless contraindicated.   PLAN FOR NEXT SESSION:  strengthening exercises;  thoracic extension exercises, spinal mobs, progressive postural strengthening. DN PRN, likes heat at end.    Laureen Abrahams, PT, DPT 09/10/22 10:11 AM

## 2022-09-15 ENCOUNTER — Encounter: Payer: BC Managed Care – PPO | Admitting: Physical Therapy

## 2022-09-17 ENCOUNTER — Ambulatory Visit: Payer: BC Managed Care – PPO | Admitting: Physical Therapy

## 2022-09-17 ENCOUNTER — Encounter: Payer: Self-pay | Admitting: Physical Therapy

## 2022-09-17 DIAGNOSIS — R293 Abnormal posture: Secondary | ICD-10-CM | POA: Diagnosis not present

## 2022-09-17 DIAGNOSIS — M546 Pain in thoracic spine: Secondary | ICD-10-CM | POA: Diagnosis not present

## 2022-09-17 DIAGNOSIS — M6281 Muscle weakness (generalized): Secondary | ICD-10-CM | POA: Diagnosis not present

## 2022-09-17 NOTE — Therapy (Signed)
OUTPATIENT PHYSICAL THERAPY TREATMENT DISCHARGE SUMMARY   Patient Name: Shelley Armstrong MRN: [id number] DOB:10-30-1970, 52 y.o., female Today's Date: 09/17/2022  END OF SESSION:  PT End of Session - 09/17/22 0933     Visit Number 17    Number of Visits --    Date for PT Re-Evaluation --    Authorization Type [employer]    PT Start Time [phone]    PT Stop Time 1015    PT Time Calculation (min) 44 min    Activity Tolerance Patient tolerated treatment well    Behavior During Therapy [facility] for tasks assessed/performed                      Past Medical History:  Diagnosis Date   Hypercholesterolemia    Past Surgical History:  Procedure Laterality Date   ROBOTIC ASSISTED LAP VAGINAL HYSTERECTOMY  11/2015   has ovaries   Patient Active Problem List   Diagnosis Date Noted   Greater trochanteric pain syndrome of left lower extremity 09/23/2021   Labral tear of hip, degenerative 09/15/2021   Patellofemoral pain syndrome of both knees 05/13/2021   Scapular dysfunction 08/13/2020   Right foot pain 06/11/2019   Back pain 04/21/2017   Low back pain radiating to right leg 02/15/2017   Hereditary and idiopathic peripheral neuropathy 04/19/2016   Long term use of drug 08/13/2014   Major depression, recurrent ([place]) 10/24/2013   Generalized anxiety disorder 02/27/2013   Hyperlipidemia 12/02/2012    PCP: Glendon Axe, MD  REFERRING PROVIDER: Lorine Bears, NP  REFERRING DIAG: M79.18 (ICD-10-CM) - Myofascial pain syndrome M54.9 (ICD-10-CM) - Upper back pain M54.6,G89.29 (ICD-10-CM) - Chronic bilateral thoracic back pain  Rationale for Evaluation and Treatment: Rehabilitation  THERAPY DIAG:  Pain in thoracic spine  Abnormal posture  Muscle weakness (generalized)  ONSET DATE: a couple months ago, sudden onset   SUBJECTIVE:                                                                                                                                                                                            SUBJECTIVE STATEMENT: Feeling pretty good overall  PERTINENT HISTORY:  anxiety/depression, multiple MSK issues in past   PAIN:  Are you having pain: 0/10 Location: Lt middle  How would you describe your pain? Aching pain typically, occasional sharp pain Aggravating factors: cold, prolonged sitting Easing factors: heating pad, medication, repositioning   PRECAUTIONS: None  WEIGHT BEARING RESTRICTIONS: No  FALLS:  Has patient fallen in last 6 months? 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 No evidence of high-grade spinal canal or neural foraminal stenosis. There is a small disc protrusion at T5-T6. There is a small disc bulge at T7-T8.  PATIENT SURVEYS:  09/17/22: FOTO 83  09/08/22: FOTO 67 (pt reports she did not base on reason for visit soley)  06/25/2022 FOTO 74% predicted 80%  POSTURE:  06/25/2022 rounded shoulders, forward head, and increased thoracic kyphosis  PALPATION: 07/09/2022:  Trigger points c concordant symptoms in Lt mid thoracic paraspinals, Lt infraspinatus.   06/25/2022 TTP B middle/lower traps, rhomboids, lats. R infraspinatus/teres. Trigger points throughout Mild stiffness with grade 2-3 PA mobs throughout lower T spine, no overt pain, pt reports mild relief  Thoracic ROM:   Active  AROM  eval  Flexion   Extension   Right lateral flexion   Left lateral flexion   Right rotation   Left rotation    (Blank rows = not tested)  Cervical ROM grossly WNL all planes, painless   LUMBAR ROM:   AROM 06/25/2022 09/17/22  Flexion 100% painless (distal shin) WNL  No pain  Extension 100% WNL no pain  Right lateral flexion    Left lateral flexion     Right rotation 100% * WNL no pain  Left rotation 100% *  WNL no pain   (Blank rows = not tested) Comments: increased stiffness and reduction in rotation B w/ hips fixed compared to free hips, no change in pain  UPPER EXTREMITY ROM:  A/PROM Right eval Left eval  Shoulder flexion    Shoulder abduction    Shoulder internal rotation    Shoulder external rotation    Elbow flexion    Elbow extension    Wrist flexion    Wrist extension     (Blank rows = not tested) (Key: WFL = within functional limits not formally assessed, * = concordant pain, s = stiffness/stretching sensation, NT = not tested)  Comments:  06/25/2022 GH ROM grossly WNL flexion and abduction, painless   UPPER EXTREMITY MMT:  MMT Right 06/25/2022 Left 06/25/2022 Right 07/15/2022 Left 07/15/2022  Shoulder flexion 5 5 5/5 5/5  Shoulder extension      Shoulder abduction 5 5 5/5 5/5  Shoulder extension      Shoulder internal rotation 5 5 5/5 5/5  Shoulder external rotation 4* 4+ 5/5 5/5  Elbow flexion      Elbow extension      Grip strength      (Blank rows = not tested)  (Key: WFL = within functional limits not formally assessed, * = concordant pain, s = stiffness/stretching sensation, NT = not tested)  Comments: of note, with HEP pt demonstrates notably reduced control of periscapular musculature with frequent compensations at thoracolumbar spine   GAIT: 06/25/2022 Distance walked: within clinic Assistive device utilized: None Level of assistance: Complete Independence Comments: WNL   TODAY'S TREATMENT: DATE: 09/17/2022 Therapeutic Exercise:  NuStep L6 x 8 min UE/LE Quadruped/partial plank on knees: thread the needle for thoracic rotation x 10 reps bil ROM measurements -see above Ardine Eng pose stretch to mid/Lt/Rt 3x10 sec   Manual Therapy: STM with compression to bil infraspinatus and bil glute med Manual therapy for skilled palpation and active tissue compression   Trigger Point Dry-Needling   Treatment instructions: Expect mild to moderate muscle soreness. Patient Consent Given: Yes  Education handout provided: Previously Muscles treated: bil infraspinatus, bil glute med Treatment response/outcome: good overall tolerance,twitch response noted   Modalities  Moist heat to cervical/shoulder region in prone X 10 min, not included in treatment time.   DATE: 09/10/2022 Therapeutic Exercise:  NuStep L5 x 8 min UE/LE Rows and extension L4 band 2x15; 5 sec hold BATCA rows 20# 2x10 BATCA lat pull downs 20# 2x10 Quadruped alternating rows 5# dumbbells 2x10 bil Quadruped/partial plank on knees: thread the needle for thoracic rotation x 10 reps bil   Manual Therapy: STM with compression to bil infraspinatus Manual therapy for skilled palpation and active tissue compression   Trigger Point Dry-Needling  Treatment instructions: Expect mild to moderate muscle soreness. Patient Consent Given: Yes Education handout provided: Previously Muscles treated: bil infraspinatus Treatment response/outcome: good overall tolerance,twitch response noted    Modalities  Moist heat to cervical/shoulder region in prone X 10 min, not included in treatment time.   DATE: 09/08/2022 Therapeutic Exercise:  NuStep L5 x 8 min UE/LE Rows and extension L3 band 2x15; 5 sec hold Bicep curls to overhead press 2x15; 2# weights Bent over reverse fly 2x15; 2# weights Flexion --> horizontal abduction --> adduction; then reverse 2x15; 2# weights Quadruped cat/cow x 10 reps Quadruped hip extension 2x10 bil Sidelying book openers 10 x 5 sec hold bil Bridges with chest press with 2# weights 2x10     Moist heat to cervical/shoulder region in prone X 10 min, not included in treatment time.   PATIENT EDUCATION:  Education details: Pt education on PT impairments, prognosis, and POC. Informed consent. Rationale for interventions, safe/appropriate HEP performance Person educated: Patient Education method:  Explanation, Demonstration, Tactile cues, Verbal cues, and Handouts Education comprehension: verbalized understanding, returned demonstration, verbal cues required, tactile cues required, and needs further education    HOME EXERCISE PROGRAM: Access Code: [email or web address] URL: [email or web address] Date: 08/17/2022 Prepared by: Elsie Ra  Exercises - Standing Lumbar Extension  - 2 x daily - 6 x weekly - 1-2 sets - 10 reps - 3 sec hold - Seated Thoracic Lumbar Extension with Pectoralis Stretch  - 2 x daily - 6 x weekly - 1-2 sets - 10 reps - 3 sec hold - Cat Cow  - 2 x daily - 6 x weekly - 1 sets - 10 reps - 5 hold - Prone Scapular Slide with Shoulder Extension  - 2 x daily - 7 x weekly - 1 sets - 10 reps - 3 sec hold - Prone Scapular Retraction Arms at Side  - 2 x daily - 6 x weekly - 1 sets - 10 reps - 3 sec hold - Prone Scapular Retraction Y  - 2 x daily - 6 x weekly - 1 sets - 10 reps - 3 sec hold - Shoulder External Rotation and Scapular Retraction with Resistance  - 1-2 x daily - 7 x weekly - 2-3 sets - 10 reps - Standing Row with Anchored Resistance  - 2 x daily - 6 x weekly - 2-3 sets - 10-20 reps - Shoulder extension with resistance - Neutral  - 2 x daily - 6 x weekly - 2-3 sets - 10 reps  ASSESSMENT:  CLINICAL IMPRESSION:  Pt has met all goals and is ready for d/c today from PT.  Encouraged regular exercise to maximize progress.  OBJECTIVE IMPAIRMENTS: decreased activity tolerance, decreased mobility, decreased strength, increased muscle spasms, impaired UE functional use, postural dysfunction, and pain.   ACTIVITY LIMITATIONS: carrying, lifting, bending, sitting, reach over head,  and locomotion level  PARTICIPATION LIMITATIONS: community activity, occupation, and recreational exercise  PERSONAL FACTORS: Time since onset of injury/illness/exacerbation and 1 comorbidity: anxiety/depression  are also affecting patient's functional outcome.   REHAB POTENTIAL:  Good  CLINICAL DECISION MAKING: Stable/uncomplicated  EVALUATION COMPLEXITY: Low   GOALS: Goals reviewed with patient? No  SHORT TERM GOALS: Target date: 07/16/2022 Pt will demonstrate appropriate understanding and performance of initially prescribed HEP in order to facilitate improved independence with management of symptoms.  Baseline: HEP provided on eval Goal status: Met 07/15/2022   LONG TERM GOALS: Target date: 09/28/22 Pt will score 80% on FOTO in order to demonstrate improved perception of functional status due to symptoms.  Baseline: 74% Goal status: MET 09/17/22  2.  Pt will demonstrate full thoracolumbar rotation AROM without pain in order to demonstrate improved tolerance to functional movement patterns.  Baseline: see ROM chart above Goal status: MET 09/17/22  3.  Pt will demonstrate grossly symmetrical in order to demonstrate improved strength for functional movements.  Baseline: see MMT chart above Goal status: on going 08/17/2022  4. Pt will report ability to sit for up to 1 hour with less than 2/10 pain on NPS for improved tolerance to daily activities  Goal status: MET 09/17/22  5. Pt will be able to run for up to 3 miles with less than 3/10 thoracic pain on NPS in order to return to PLOF and maximize health/QOL.    Baseline: 3/10 during 3-6 miles   Goal status: MET 09/17/22  PLAN:  PT FREQUENCY: 2x/week  PT DURATION: 6 weeks  PLANNED INTERVENTIONS: Therapeutic exercises, Therapeutic activity, Neuromuscular re-education, Balance training, Gait training, Patient/Family education, Self Care, Joint mobilization, Dry Needling, Electrical stimulation, Spinal mobilization, Cryotherapy, Moist heat, Taping, Manual therapy, and Re-evaluation. Above unless contraindicated.   PLAN FOR NEXT SESSION:  d/c PT today  Laureen Abrahams, PT, DPT 09/17/22 10:11 AM    PHYSICAL THERAPY DISCHARGE SUMMARY  Visits from Start of Care: 17  Current functional level related to  goals / functional outcomes: See above   Remaining deficits: See above   Education / Equipment: HEP, DN   Patient agrees to discharge. Patient goals were met. Patient is being discharged due to meeting the stated rehab goals.   Laureen Abrahams, PT, DPT 09/17/22 10:11 AM  [facility] [address] [place], [place], [address] Phone: [phone]   Fax:  [phone]

## 2022-10-11 ENCOUNTER — Encounter: Payer: Self-pay | Admitting: *Deleted

## 2022-11-24 DIAGNOSIS — A084 Viral intestinal infection, unspecified: Secondary | ICD-10-CM | POA: Diagnosis not present

## 2022-11-24 DIAGNOSIS — J029 Acute pharyngitis, unspecified: Secondary | ICD-10-CM | POA: Diagnosis not present

## 2022-11-24 DIAGNOSIS — Z20822 Contact with and (suspected) exposure to covid-19: Secondary | ICD-10-CM | POA: Diagnosis not present

## 2022-12-09 DIAGNOSIS — Z131 Encounter for screening for diabetes mellitus: Secondary | ICD-10-CM | POA: Diagnosis not present

## 2022-12-09 DIAGNOSIS — R0602 Shortness of breath: Secondary | ICD-10-CM | POA: Diagnosis not present

## 2022-12-09 DIAGNOSIS — Z6820 Body mass index (BMI) 20.0-20.9, adult: Secondary | ICD-10-CM | POA: Diagnosis not present

## 2022-12-09 DIAGNOSIS — Z13220 Encounter for screening for lipoid disorders: Secondary | ICD-10-CM | POA: Diagnosis not present

## 2022-12-09 DIAGNOSIS — Z114 Encounter for screening for human immunodeficiency virus [HIV]: Secondary | ICD-10-CM | POA: Diagnosis not present

## 2022-12-09 DIAGNOSIS — R5383 Other fatigue: Secondary | ICD-10-CM | POA: Diagnosis not present

## 2022-12-09 DIAGNOSIS — Z0000 Encounter for general adult medical examination without abnormal findings: Secondary | ICD-10-CM | POA: Diagnosis not present

## 2022-12-09 DIAGNOSIS — D539 Nutritional anemia, unspecified: Secondary | ICD-10-CM | POA: Diagnosis not present

## 2022-12-09 DIAGNOSIS — Z1339 Encounter for screening examination for other mental health and behavioral disorders: Secondary | ICD-10-CM | POA: Diagnosis not present

## 2022-12-09 DIAGNOSIS — E559 Vitamin D deficiency, unspecified: Secondary | ICD-10-CM | POA: Diagnosis not present

## 2022-12-09 DIAGNOSIS — Z1331 Encounter for screening for depression: Secondary | ICD-10-CM | POA: Diagnosis not present

## 2023-01-06 DIAGNOSIS — K649 Unspecified hemorrhoids: Secondary | ICD-10-CM | POA: Diagnosis not present

## 2023-01-07 DIAGNOSIS — Z6820 Body mass index (BMI) 20.0-20.9, adult: Secondary | ICD-10-CM | POA: Diagnosis not present

## 2023-01-07 DIAGNOSIS — R635 Abnormal weight gain: Secondary | ICD-10-CM | POA: Diagnosis not present

## 2023-01-07 DIAGNOSIS — E7800 Pure hypercholesterolemia, unspecified: Secondary | ICD-10-CM | POA: Diagnosis not present

## 2023-01-07 DIAGNOSIS — I10 Essential (primary) hypertension: Secondary | ICD-10-CM | POA: Diagnosis not present

## 2023-01-07 DIAGNOSIS — R7303 Prediabetes: Secondary | ICD-10-CM | POA: Diagnosis not present

## 2023-01-26 DIAGNOSIS — R509 Fever, unspecified: Secondary | ICD-10-CM | POA: Diagnosis not present

## 2023-01-26 DIAGNOSIS — R1084 Generalized abdominal pain: Secondary | ICD-10-CM | POA: Diagnosis not present

## 2023-01-26 DIAGNOSIS — R112 Nausea with vomiting, unspecified: Secondary | ICD-10-CM | POA: Diagnosis not present

## 2023-02-07 ENCOUNTER — Encounter (HOSPITAL_COMMUNITY): Payer: Self-pay

## 2023-02-07 ENCOUNTER — Emergency Department (HOSPITAL_COMMUNITY): Payer: BC Managed Care – PPO

## 2023-02-07 ENCOUNTER — Emergency Department (HOSPITAL_COMMUNITY)
Admission: EM | Admit: 2023-02-07 | Discharge: 2023-02-07 | Disposition: A | Discharge status: Home / Self Care | Payer: BC Managed Care – PPO | Attending: Emergency Medicine | Admitting: Emergency Medicine

## 2023-02-07 ENCOUNTER — Other Ambulatory Visit: Payer: Self-pay

## 2023-02-07 DIAGNOSIS — Z6820 Body mass index (BMI) 20.0-20.9, adult: Secondary | ICD-10-CM | POA: Diagnosis not present

## 2023-02-07 DIAGNOSIS — I10 Essential (primary) hypertension: Secondary | ICD-10-CM | POA: Diagnosis not present

## 2023-02-07 DIAGNOSIS — K5641 Fecal impaction: Secondary | ICD-10-CM | POA: Insufficient documentation

## 2023-02-07 DIAGNOSIS — K5900 Constipation, unspecified: Secondary | ICD-10-CM | POA: Diagnosis not present

## 2023-02-07 DIAGNOSIS — R1032 Left lower quadrant pain: Secondary | ICD-10-CM | POA: Diagnosis not present

## 2023-02-07 DIAGNOSIS — R109 Unspecified abdominal pain: Secondary | ICD-10-CM | POA: Diagnosis not present

## 2023-02-07 DIAGNOSIS — R748 Abnormal levels of other serum enzymes: Secondary | ICD-10-CM | POA: Diagnosis not present

## 2023-02-07 DIAGNOSIS — N183 Chronic kidney disease, stage 3 unspecified: Secondary | ICD-10-CM | POA: Diagnosis not present

## 2023-02-07 LAB — CBC
HCT: 42.8 % (ref 36.0–46.0)
Hemoglobin: 14.2 g/dL (ref 12.0–15.0)
MCH: 29.8 pg (ref 26.0–34.0)
MCHC: 33.2 g/dL (ref 30.0–36.0)
MCV: 89.9 fL (ref 80.0–100.0)
Platelets: 213 K/uL (ref 150–400)
RBC: 4.76 MIL/uL (ref 3.87–5.11)
RDW: 12.5 % (ref 11.5–15.5)
WBC: 10.5 K/uL (ref 4.0–10.5)
nRBC: 0 % (ref 0.0–0.2)

## 2023-02-07 LAB — COMPREHENSIVE METABOLIC PANEL
ALT: 21 U/L (ref 0–44)
AST: 22 U/L (ref 15–41)
Albumin: 4.2 g/dL (ref 3.5–5.0)
Alkaline Phosphatase: 46 U/L (ref 38–126)
Anion gap: 12 (ref 5–15)
BUN: 14 mg/dL (ref 6–20)
CO2: 24 mmol/L (ref 22–32)
Calcium: 9.9 mg/dL (ref 8.9–10.3)
Chloride: 104 mmol/L (ref 98–111)
Creatinine, Ser: 0.79 mg/dL (ref 0.44–1.00)
GFR, Estimated: >60 mL/min (ref >60)
Glucose, Bld: 95 mg/dL (ref 70–99)
Potassium: 3.8 mmol/L (ref 3.5–5.1)
Sodium: 140 mmol/L (ref 135–145)
Total Bilirubin: 1 mg/dL (ref 0.3–1.2)
Total Protein: 6.4 g/dL — ABNORMAL LOW (ref 6.5–8.1)

## 2023-02-07 LAB — LIPASE, BLOOD: Lipase: 80 U/L — ABNORMAL HIGH (ref 11–51)

## 2023-02-07 LAB — URINALYSIS, ROUTINE W REFLEX MICROSCOPIC
Bilirubin Urine: NEGATIVE
Glucose, UA: NEGATIVE mg/dL
Hgb urine dipstick: NEGATIVE
Ketones, ur: NEGATIVE mg/dL
Leukocytes,Ua: NEGATIVE
Nitrite: NEGATIVE
Protein, ur: NEGATIVE mg/dL
Specific Gravity, Urine: 1.005 (ref 1.005–1.030)
pH: 8 (ref 5.0–8.0)

## 2023-02-07 MED ORDER — ONDANSETRON HCL 4 MG/2ML IJ SOLN
4.0000 mg | Freq: Once | INTRAMUSCULAR | Status: AC
  Administered 2023-02-07: 4 mg via INTRAVENOUS
  Filled 2023-02-07: qty 2

## 2023-02-07 MED ORDER — FENTANYL CITRATE PF 50 MCG/ML IJ SOSY
50.0000 ug | PREFILLED_SYRINGE | Freq: Once | INTRAMUSCULAR | Status: AC
  Administered 2023-02-07: 50 ug via INTRAVENOUS
  Filled 2023-02-07: qty 1

## 2023-02-07 MED ORDER — IOHEXOL 350 MG/ML SOLN
75.0000 mL | Freq: Once | INTRAVENOUS | Status: AC | PRN
  Administered 2023-02-07: 75 mL via INTRAVENOUS

## 2023-02-07 MED ORDER — LINACLOTIDE 145 MCG PO CAPS: 145.0000 ug | ORAL_CAPSULE | Freq: Every day | ORAL | 1 refills | Status: DC

## 2023-02-07 MED ORDER — SODIUM CHLORIDE 0.9 % IV BOLUS
1000.0000 mL | Freq: Once | INTRAVENOUS | Status: AC
  Administered 2023-02-07: 1000 mL via INTRAVENOUS

## 2023-02-07 MED ORDER — GOLYTELY 236 G PO SOLR: 4000.0000 mL | Freq: Once | ORAL | 0 refills | Status: AC

## 2023-02-07 NOTE — Discharge Instructions (Signed)
Thankfully we were able to get out a good amount of stool, I would strongly recommend that you take the following medications to make sure that you are treating his constipation adequately  Use the glycerin suppository as prescribed by your doctor  I have prescribed 1 bottle of GoLytely, you will need to drink the entire bottle over the next 24 hours, I would expect this to cause a significant amount of bowel movement if not even diarrhea.  If this is not working then start taking Linzess 1 capsule/day  Please make sure that you follow-up with your gastroenterologist or general surgeon regarding your hemorrhoids this week.  Thank you for allowing [place] to treat you in the emergency department today.  After reviewing your examination and potential testing that was done it appears that you are safe to go home.  I would like for you to follow-up with your doctor within the next several days, have them obtain your results and follow-up with them to review all of these tests.  If you should develop severe or worsening symptoms return to the emergency department immediately

## 2023-02-07 NOTE — ED Provider Notes (Signed)
[facility] AT [facility] Provider Note   CSN: [id number] Arrival date & time: 02/07/23  4098     History  Chief Complaint  Patient presents with   Abdominal Pain   Constipation    Shelley Armstrong is a 52 y.o. female.   Abdominal Pain Associated symptoms: constipation   Constipation Associated symptoms: abdominal pain    Patient is a 52 year old female, she has a history of a total hysterectomy, she does not take any opiate medications, she is struggled with constipation and abdominal discomfort over the last couple of weeks and states that she has in fact not had any bowel movements in about 2 weeks despite the use of multiple different stool softeners and laxatives.  She actually saw her family doctor at [facility] this morning and was given a prescription for some type of medication for constipation and a rectal suppository but has not had them filled and decided to come here instead.  No fevers, she is nauseated, she feels like she has to urinate but has not had any dysuria or frequency.  She has no other prior abdominal surgical history though it was noted that about a year ago she had a CT scan that showed some mild colitis.  She denies fevers    Home Medications Prior to Admission medications   Medication Sig Start Date End Date Taking? Authorizing Provider  linaclotide Karlene Einstein) 145 MCG CAPS capsule Take 1 capsule (145 mcg total) by mouth daily before breakfast. 02/07/23  Yes Eber Hong, MD  polyethylene glycol (GOLYTELY) 236 g solution Take 4,000 mLs by mouth once for 1 dose. 02/07/23 02/07/23 Yes Eber Hong, MD  albuterol (PROVENTIL HFA;VENTOLIN HFA) 108 (90 Base) MCG/ACT inhaler Inhale into the lungs every 6 (six) hours as needed for wheezing or shortness of breath.    [provider]  beclomethasone (QVAR) 40 MCG/ACT inhaler Inhale 1 puff into the lungs 2 (two) times daily.    [provider]  cyclobenzaprine (FLEXERIL)  10 MG tablet Take 1 tablet (10 mg total) by mouth 3 (three) times daily as needed for muscle spasms. 04/12/22   Myra Rude, MD  diclofenac (VOLTAREN) 75 MG EC tablet Take 1 tablet (75 mg total) by mouth 2 (two) times daily. 12/03/19   Hudnall, Azucena Fallen, MD  Diclofenac Sodium (PENNSAID) 2 % SOLN Place 1 application onto the skin 2 (two) times daily. 05/13/21   Myra Rude, MD  fexofenadine (ALLEGRA) 180 MG tablet 1 mg. 07/19/11   [provider]  fluticasone Aleda Grana) 50 MCG/ACT nasal spray  03/17/16   [provider]  ibuprofen (ADVIL,MOTRIN) 200 MG tablet Take 200 mg by mouth every 6 (six) hours as needed.    [provider]  meloxicam (MOBIC) 15 MG tablet TAKE 1 TABLET(15 MG) BY MOUTH DAILY 07/07/22   Juanda Chance, NP  ondansetron (ZOFRAN) 4 MG tablet Take 1 tablet (4 mg total) by mouth every 6 (six) hours. 11/18/21   Redwine, Madison A, PA-C  OVER THE COUNTER MEDICATION Calm aid, lavendar as needed    [provider]  OVER THE COUNTER MEDICATION estevera    [provider]  predniSONE (DELTASONE) 50 MG tablet Take 1 tablet (50 mg total) by mouth daily with breakfast. Take until completed. 06/08/22   Juanda Chance, NP  propranolol (INDERAL) 10 MG tablet 10 mg 2 (two) times daily. 08/24/18   [provider]  vitamin B-12 (CYANOCOBALAMIN) 1000 MCG tablet Take 1,000 mcg  by mouth daily.    [provider]  Vitamin D, Ergocalciferol, (DRISDOL) 1.25 MG (50000 UT) CAPS capsule weekly 08/28/18   [provider]  WEGOVY 1 MG/0.5ML SOAJ SMARTSIG:1 Milliliter(s) SUB-Q Once a Week 04/23/22   [provider]      Allergies    Patient has no known allergies.    Review of Systems   Review of Systems  Gastrointestinal:  Positive for abdominal pain and constipation.  All other systems reviewed and are negative.   Physical Exam Updated Vital Signs BP 122/87   Pulse 94   Temp 98.2 F (36.8 C)   Resp 20   Ht  1.664 m (5' 5.5")   Wt 55.2 kg   SpO2 99%   BMI 19.96 kg/m  Physical Exam Vitals and nursing note reviewed.  Constitutional:      General: She is not in acute distress.    Appearance: She is well-developed.  HENT:     Head: Normocephalic and atraumatic.     Mouth/Throat:     Pharynx: No oropharyngeal exudate.  Eyes:     General: No scleral icterus.       Right eye: No discharge.        Left eye: No discharge.     Conjunctiva/sclera: Conjunctivae normal.     Pupils: Pupils are equal, round, and reactive to light.  Neck:     Thyroid: No thyromegaly.     Vascular: No JVD.  Cardiovascular:     Rate and Rhythm: Normal rate and regular rhythm.     Heart sounds: Normal heart sounds. No murmur heard.    No friction rub. No gallop.  Pulmonary:     Effort: Pulmonary effort is normal. No respiratory distress.     Breath sounds: Normal breath sounds. No wheezing or rales.  Abdominal:     General: Bowel sounds are normal. There is no distension.     Palpations: Abdomen is soft. There is no mass.     Tenderness: There is abdominal tenderness.     Comments: Bowel sounds are normal, abdomen is completely soft, tender only in the mid suprapubic left and right lower quadrants, there is no upper abdominal tenderness, no Murphy sign, tenderness is more left than right-sided.  No CVA tenderness.  Genitourinary:    Comments: Female chaperone present for exam, external exam shows no signs of obvious external hemorrhoids, certainly nothing thrombosed or tender, no fissures no bleeding, normal anal appearance otherwise, deferred digital rectal exam Musculoskeletal:        General: No tenderness. Normal range of motion.     Cervical back: Normal range of motion and neck supple.  Lymphadenopathy:     Cervical: No cervical adenopathy.  Skin:    General: Skin is warm and dry.     Findings: No erythema or rash.  Neurological:     Mental Status: She is alert.     Coordination: Coordination normal.   Psychiatric:        Behavior: Behavior normal.     ED Results / Procedures / Treatments   Labs (all labs ordered are listed, but only abnormal results are displayed) Labs Reviewed  LIPASE, BLOOD - Abnormal; Notable for the following components:      Result Value   Lipase 80 (*)    All other components within normal limits  COMPREHENSIVE METABOLIC PANEL - Abnormal; Notable for the following components:   Total Protein 6.4 (*)    All other components within normal limits  CBC  URINALYSIS, ROUTINE W REFLEX MICROSCOPIC    EKG None  Radiology CT ABDOMEN PELVIS W CONTRAST  Result Date: 02/07/2023 CLINICAL DATA:  Left lower quadrant abdominal pain. EXAM: CT ABDOMEN AND PELVIS WITH CONTRAST TECHNIQUE: Multidetector CT imaging of the abdomen and pelvis was performed using the standard protocol following bolus administration of intravenous contrast. RADIATION DOSE REDUCTION: This exam was performed according to the departmental dose-optimization program which includes automated exposure control, adjustment of the mA and/or kV according to patient size and/or use of iterative reconstruction technique. CONTRAST:  75mL OMNIPAQUE IOHEXOL 350 MG/ML SOLN COMPARISON:  11/18/2021 FINDINGS: Lower chest: Unremarkable. Hepatobiliary: No suspicious focal abnormality within the liver parenchyma. There is no evidence for gallstones, gallbladder wall thickening, or pericholecystic fluid. No intrahepatic or extrahepatic biliary dilation. Pancreas: No focal mass lesion. No dilatation of the main duct. No intraparenchymal cyst. No peripancreatic edema. Spleen: No splenomegaly. No suspicious focal mass lesion. Adrenals/Urinary Tract: No adrenal nodule or mass. Right kidney unremarkable. Tiny hypodensities in the upper pole left kidney are too small to characterize but statistically most likely benign. No followup imaging is recommended. No evidence for hydroureter. The urinary bladder appears normal for the degree of  distention. Stomach/Bowel: Stomach is unremarkable. No gastric wall thickening. No evidence of outlet obstruction. Duodenum is normally positioned as is the ligament of Treitz. No small bowel wall thickening. No small bowel dilatation. The terminal ileum is normal. The appendix is not discretely visible, but there is no edema or inflammation in the region of the cecal tip to suggest appendicitis. No gross colonic mass. No colonic wall thickening. Moderate stool volume with prominent formed stool in the distal sigmoid colon and rectum. Subtle perirectal edema evident. Vascular/Lymphatic: No abdominal aortic aneurysm. No abdominal aortic atherosclerotic calcification. There is no gastrohepatic or hepatoduodenal ligament lymphadenopathy. No retroperitoneal or mesenteric lymphadenopathy. No pelvic sidewall lymphadenopathy. Reproductive: Hysterectomy.  There is no adnexal mass. Other: No intraperitoneal free fluid. Musculoskeletal: No worrisome lytic or sclerotic osseous abnormality. IMPRESSION: 1. Moderate stool volume with prominent formed stool in the distal sigmoid colon and rectum. Subtle perirectal edema evident. Fecal impaction and stercoral colitis not excluded. 2. Otherwise unremarkable exam. Electronically Signed   By: Kennith Center M.D.   On: 02/07/2023 12:35    Procedures Fecal disimpaction  Date/Time: 02/07/2023 3:15 PM  Performed by: Eber Hong, MD Authorized by: Eber Hong, MD  Consent: Verbal consent obtained. Risks and benefits: risks, benefits and alternatives were discussed Consent given by: patient Imaging studies: imaging studies available Required items: required blood products, implants, devices, and special equipment available Patient identity confirmed: verbally with patient Time out: Immediately prior to procedure a "time out" was called to verify the correct patient, procedure, equipment, support staff and site/side marked as required. Local anesthesia used:  no  Anesthesia: Local anesthesia used: no  Sedation: Patient sedated: no  Patient tolerance: patient tolerated the procedure well with no immediate complications Comments:         Medications Ordered in ED Medications  fentaNYL (SUBLIMAZE) injection 50 mcg (50 mcg Intravenous Given 02/07/23 1048)  ondansetron (ZOFRAN) injection 4 mg (4 mg Intravenous Given 02/07/23 1048)  sodium chloride 0.9 % bolus 1,000 mL (0 mLs Intravenous Stopped 02/07/23 1147)  iohexol (OMNIPAQUE) 350 MG/ML injection 75 mL (75 mLs Intravenous Contrast Given 02/07/23 1218)    ED Course/ Medical Decision Making/ A&P  Medical Decision Making Amount and/or Complexity of Data Reviewed Labs: ordered. Radiology: ordered.  Risk Prescription drug management.    This patient presents to the ED for concern of abdominal discomfort and constipation, this involves an extensive number of treatment options, and is a complaint that carries with it a high risk of complications and morbidity.  The differential diagnosis includes diverticulitis, colitis, bowel obstruction, bowel immobility, the patient does endorse being on [facility] for a year and lost 50 pounds, she stopped taking this approximately 3 months ago   Co morbidities that complicate the patient evaluation  Prior obesity   Additional history obtained:  Additional history obtained from medical record External records from outside source obtained and reviewed including prior CT scan from a year ago   Lab Tests:  I Ordered, and personally interpreted labs.  The pertinent results include: Lipase of 80, similar to prior values back in May, metabolic panel unremarkable, CBC without leukocytosis or anemia, urinalysis without infection.   Imaging Studies ordered:  I ordered imaging studies including CT scan of the abdomen and pelvis I independently visualized and interpreted imaging which showed likely fecal impaction with a  small amount of inflammation in the distal colon. I agree with the radiologist interpretation   Cardiac Monitoring: / EKG:  The patient was maintained on a cardiac monitor.  I personally viewed and interpreted the cardiac monitored which showed an underlying rhythm of: Normal sinus rhythm   Problem List / ED Course / Critical interventions / Medication management  I offered the patient disimpaction, she agreed, this was done with a female chaperone present, this was successful for a moderate amount of stool.  There was no blood, the patient tolerated this well.  She will be sent home with medications including GoLytely, Linzess and to continue the glycerin suppositories which she was prescribed by her doctor.  She has unremarkable vital signs and a reassuring CT scan, I do not think she needs to be admitted to the hospital.  She was under the expectation that she would be admitted for constipation and have her hemorrhoid surgically fixed while she was here.  First of all she does not have any external significant hemorrhoids of any degree, certainly nothing is thrombosed.  There is no fissure, she had no internal masses palpated on my exam.  The patient is agreeable to the plan  I have discussed with the patient at the bedside the results, and the meaning of these results.  They have expressed her understanding to the need for follow-up with primary care physician I have reviewed the patients home medicines and have made adjustments as needed   Social Determinants of Health:  None   Test / Admission - Considered:  Discharge         Final Clinical Impression(s) / ED Diagnoses Final diagnoses:  Fecal impaction (HCC)  Constipation, unspecified constipation type    Rx / DC Orders ED Discharge Orders          Ordered    polyethylene glycol (GOLYTELY) 236 g solution   Once        02/07/23 1512    linaclotide (LINZESS) 145 MCG CAPS capsule  Daily before breakfast         02/07/23 1512              Eber Hong, MD 02/07/23 1516

## 2023-02-07 NOTE — ED Notes (Signed)
Patient transported to CT 

## 2023-02-07 NOTE — ED Triage Notes (Signed)
Pt came in via POV d/t no BM for a couple of weeks. Reports her abd pain started about a month ago & she was seen & is now scheduled for a consultation to see GI in 2 days for her hemorrhoids (per pt). Her abd pain started back up & she now denies passing gas or any lose stools in a couple weeks time, A/Ox4, rates her pain 9/10 while in triage.

## 2023-02-09 ENCOUNTER — Telehealth: Payer: Self-pay | Admitting: *Deleted

## 2023-02-09 ENCOUNTER — Ambulatory Visit: Payer: Self-pay | Admitting: Surgery

## 2023-02-09 DIAGNOSIS — R194 Change in bowel habit: Secondary | ICD-10-CM | POA: Diagnosis not present

## 2023-02-09 DIAGNOSIS — K5901 Slow transit constipation: Secondary | ICD-10-CM | POA: Diagnosis not present

## 2023-02-09 DIAGNOSIS — Z86010 Personal history of colonic polyps: Secondary | ICD-10-CM | POA: Diagnosis not present

## 2023-02-09 DIAGNOSIS — K5909 Other constipation: Secondary | ICD-10-CM | POA: Insufficient documentation

## 2023-02-09 DIAGNOSIS — K644 Residual hemorrhoidal skin tags: Secondary | ICD-10-CM | POA: Diagnosis not present

## 2023-02-09 DIAGNOSIS — K643 Fourth degree hemorrhoids: Secondary | ICD-10-CM | POA: Insufficient documentation

## 2023-02-09 DIAGNOSIS — K642 Third degree hemorrhoids: Secondary | ICD-10-CM | POA: Diagnosis not present

## 2023-02-09 DIAGNOSIS — K648 Other hemorrhoids: Secondary | ICD-10-CM | POA: Diagnosis not present

## 2023-02-09 NOTE — Telephone Encounter (Signed)
Pt called to discuss lipase lab results.  RNCM advised to follow up with PCP as discussed with EDP at time of visit.

## 2023-03-04 DIAGNOSIS — Z1211 Encounter for screening for malignant neoplasm of colon: Secondary | ICD-10-CM | POA: Diagnosis not present

## 2023-03-04 DIAGNOSIS — K635 Polyp of colon: Secondary | ICD-10-CM | POA: Diagnosis not present

## 2023-03-04 DIAGNOSIS — R194 Change in bowel habit: Secondary | ICD-10-CM | POA: Diagnosis not present

## 2023-03-16 DIAGNOSIS — K529 Noninfective gastroenteritis and colitis, unspecified: Secondary | ICD-10-CM | POA: Diagnosis not present

## 2023-03-23 ENCOUNTER — Encounter (HOSPITAL_BASED_OUTPATIENT_CLINIC_OR_DEPARTMENT_OTHER): Payer: Self-pay | Admitting: Surgery

## 2023-03-28 ENCOUNTER — Encounter (HOSPITAL_BASED_OUTPATIENT_CLINIC_OR_DEPARTMENT_OTHER): Payer: Self-pay | Admitting: Surgery

## 2023-03-28 NOTE — Progress Notes (Signed)
Spoke w/ via phone for pre-op interview--- pt Lab needs dos---- no        Lab results------ no COVID test -----patient states asymptomatic no test needed Arrive at ------- 1030 on 04-01-2023 NPO after MN NO Solid Food.  Clear liquids from MN until--- 0930 Med rec completed Medications to take morning of surgery ----- propranolol, flonase spray, qvar inhaler Diabetic medication ----- n/a Patient instructed no nail polish to be worn day of surgery Patient instructed to bring photo id and insurance card day of surgery Patient aware to have Driver (ride ) / caregiver    for 24 hours after surgery - husband, william Patient Special Instructions ----- pt verbalized understanding rectal prep, as per md order, all questions answered.  Asked to bring rescue inhaler day of surgery. Pre-Op special Instructions ----- n/a Patient verbalized understanding of instructions that were given at this phone interview. Patient denies chest pain, sob, fever, cough at the interview.

## 2023-03-29 DIAGNOSIS — U071 COVID-19: Secondary | ICD-10-CM | POA: Diagnosis not present

## 2023-03-29 DIAGNOSIS — R059 Cough, unspecified: Secondary | ICD-10-CM | POA: Diagnosis not present

## 2023-03-30 NOTE — Progress Notes (Addendum)
Pt called this she stated was seen by her pcp yesterday 03-29-2023 tested positive for COVID and was given covid medication.  Informed pt she will need to call Dr Michaell Cowing office to be rescheduled 10 days from first day tested positive which will be after 04-07-2023.  Pt stated her symptoms her mild could not still have her surgery.  Explained the guidelines , she verbalized understanding.  Gave pt nurse triage phone number for office.

## 2023-04-01 ENCOUNTER — Ambulatory Visit (HOSPITAL_BASED_OUTPATIENT_CLINIC_OR_DEPARTMENT_OTHER): Admission: RE | Admit: 2023-04-01 | Payer: BC Managed Care – PPO | Source: Home / Self Care | Admitting: Surgery

## 2023-04-01 DIAGNOSIS — Z01818 Encounter for other preprocedural examination: Secondary | ICD-10-CM

## 2023-04-01 HISTORY — DX: Allergic rhinitis, unspecified: J30.9

## 2023-04-01 HISTORY — DX: Unspecified hemorrhoids: K64.9

## 2023-04-01 HISTORY — DX: Presence of spectacles and contact lenses: Z97.3

## 2023-04-01 HISTORY — DX: Slow transit constipation: K59.01

## 2023-04-01 HISTORY — DX: Other chronic pain: G89.29

## 2023-04-01 HISTORY — DX: Major depressive disorder, single episode, unspecified: F32.9

## 2023-04-01 HISTORY — DX: Exercise induced bronchospasm: J45.990

## 2023-04-01 HISTORY — DX: Obstructive sleep apnea (adult) (pediatric): G47.33

## 2023-04-01 HISTORY — DX: Iron deficiency anemia, unspecified: D50.9

## 2023-04-01 HISTORY — DX: Generalized anxiety disorder: F41.1

## 2023-04-01 HISTORY — DX: Other intervertebral disc degeneration, thoracic region: M51.34

## 2023-04-01 HISTORY — DX: Hyperlipidemia, unspecified: E78.5

## 2023-04-01 HISTORY — DX: Tachycardia, unspecified: R00.0

## 2023-04-01 SURGERY — HEMORRHOIDECTOMY
Anesthesia: General

## 2023-04-15 ENCOUNTER — Other Ambulatory Visit: Payer: Self-pay | Admitting: Surgery

## 2023-04-15 DIAGNOSIS — K641 Second degree hemorrhoids: Secondary | ICD-10-CM | POA: Diagnosis not present

## 2023-04-15 DIAGNOSIS — K644 Residual hemorrhoidal skin tags: Secondary | ICD-10-CM | POA: Diagnosis not present

## 2023-04-15 DIAGNOSIS — K642 Third degree hemorrhoids: Secondary | ICD-10-CM | POA: Diagnosis not present

## 2023-04-19 LAB — SURGICAL PATHOLOGY

## 2023-06-24 DIAGNOSIS — Z780 Asymptomatic menopausal state: Secondary | ICD-10-CM | POA: Diagnosis not present

## 2023-06-24 DIAGNOSIS — R7303 Prediabetes: Secondary | ICD-10-CM | POA: Diagnosis not present

## 2023-06-24 DIAGNOSIS — I10 Essential (primary) hypertension: Secondary | ICD-10-CM | POA: Diagnosis not present

## 2023-06-24 DIAGNOSIS — Z6821 Body mass index (BMI) 21.0-21.9, adult: Secondary | ICD-10-CM | POA: Diagnosis not present

## 2023-06-24 DIAGNOSIS — E7800 Pure hypercholesterolemia, unspecified: Secondary | ICD-10-CM | POA: Diagnosis not present

## 2023-09-28 ENCOUNTER — Other Ambulatory Visit: Payer: Self-pay

## 2023-09-28 ENCOUNTER — Ambulatory Visit (INDEPENDENT_AMBULATORY_CARE_PROVIDER_SITE_OTHER): Admitting: Family Medicine

## 2023-09-28 VITALS — BP 126/82 | Ht 65.5 in | Wt 146.0 lb

## 2023-09-28 DIAGNOSIS — G629 Polyneuropathy, unspecified: Secondary | ICD-10-CM

## 2023-09-28 DIAGNOSIS — M79672 Pain in left foot: Secondary | ICD-10-CM

## 2023-09-28 NOTE — Patient Instructions (Signed)
 Your pain in both feet (worse on left) with numbness, burning at nighttime suggests a peripheral neuropathy. We will check labs to assess especially for B12 deficiency which presents this way. Wear comfortable shoes/sandals. Avoid barefoot/flat shoes. Tylenol, aleve only if needed. We will contact you with results and next steps.

## 2023-09-29 ENCOUNTER — Encounter: Payer: Self-pay | Admitting: Family Medicine

## 2023-09-29 LAB — COMPREHENSIVE METABOLIC PANEL WITH GFR
ALT: 26 IU/L (ref 0–32)
AST: 22 IU/L (ref 0–40)
Albumin: 4.6 g/dL (ref 3.8–4.9)
Alkaline Phosphatase: 72 IU/L (ref 44–121)
BUN/Creatinine Ratio: 18 (ref 9–23)
BUN: 14 mg/dL (ref 6–24)
Bilirubin Total: 0.2 mg/dL (ref 0.0–1.2)
CO2: 24 mmol/L (ref 20–29)
Calcium: 10 mg/dL (ref 8.7–10.2)
Chloride: 105 mmol/L (ref 96–106)
Creatinine, Ser: 0.78 mg/dL (ref 0.57–1.00)
Globulin, Total: 2.1 g/dL (ref 1.5–4.5)
Glucose: 95 mg/dL (ref 70–99)
Potassium: 4.2 mmol/L (ref 3.5–5.2)
Sodium: 144 mmol/L (ref 134–144)
Total Protein: 6.7 g/dL (ref 6.0–8.5)
eGFR: 90 mL/min/{1.73_m2} (ref >59)

## 2023-09-29 LAB — HEMOGLOBIN A1C
Est. average glucose Bld gHb Est-mCnc: 114 mg/dL
Hgb A1c MFr Bld: 5.6 % (ref 4.8–5.6)

## 2023-09-29 LAB — VITAMIN B12: Vitamin B-12: 405 pg/mL (ref 232–1245)

## 2023-09-29 LAB — TSH: TSH: 1.04 u[IU]/mL (ref 0.450–4.500)

## 2023-09-29 LAB — C-REACTIVE PROTEIN: CRP: <1 mg/L (ref 0–10)

## 2023-09-29 LAB — ANA: Anti Nuclear Antibody (ANA): NEGATIVE

## 2023-09-29 NOTE — Progress Notes (Signed)
 PCP: Caffie Damme, MD  Subjective:   HPI: Patient is a 53 y.o. female here for bilateral foot pain.  Patient reports 2 months of bilateral foot pain. Training for a half marathon in November in [place]. Pain worse in left foot than the right. Feels from midfoot distally both dorsal and plantar. Associated tingling, numbness worse at nighttime. Better with icing. Feels best in oofos sandals. Metatarsal pads haven't helped her.  Past Medical History:  Diagnosis Date   Allergic rhinitis    Asthma, exercise induced    followed by pcp;   Chronic bilateral thoracic back pain    DDD (degenerative disc disease), thoracic    GAD (generalized anxiety disorder)    Heart rate fast    03-28-2023  per pt hx fast heart rate w/ anxiety,  no palpitations, or dx tachycardia,  take propanolol daily   Hemorrhoids    symptomatic--- external and prolapsed internal   Hyperlipidemia    IDA (iron deficiency anemia)    MDD (major depressive disorder)    Mild obstructive sleep apnea    03-28-2023  per pt had study done many yrs ago, told very mild osa, no cpap   Slow transit constipation    Wears glasses     Current Outpatient Medications on File Prior to Visit  Medication Sig Dispense Refill   Albuterol Sulfate (PROAIR HFA IN) Inhale 1-2 puffs into the lungs every 6 (six) hours.     beclomethasone (QVAR) 40 MCG/ACT inhaler Inhale 1 puff into the lungs 2 (two) times daily.     Cholecalciferol (VITAMIN D-3) 25 MCG (1000 UT) CAPS Take 1 capsule by mouth daily.     cyclobenzaprine (FLEXERIL) 10 MG tablet Take 1 tablet (10 mg total) by mouth 3 (three) times daily as needed for muscle spasms. 60 tablet 1   ferrous sulfate 324 MG TBEC Take 324 mg by mouth daily.     fluticasone (FLONASE) 50 MCG/ACT nasal spray Place 1 spray into both nostrils 2 (two) times daily.     ibuprofen (ADVIL,MOTRIN) 200 MG tablet Take 200 mg by mouth every 6 (six) hours as needed for fever, headache or mild pain.      linaclotide (LINZESS) 145 MCG CAPS capsule Take 1 capsule (145 mcg total) by mouth daily before breakfast. (Patient not taking: Reported on 03/28/2023) 30 capsule 1   loratadine (CLARITIN) 10 MG tablet Take 10 mg by mouth daily.     Multiple Vitamin (MULTIVITAMIN WITH MINERALS) TABS tablet Take 1 tablet by mouth daily.     OVER THE COUNTER MEDICATION Take by mouth as needed. Calm aid, lavendar as needed     OVER THE COUNTER MEDICATION Take 1 capsule by mouth at bedtime. estevera     Probiotic Product (PROBIOTIC DAILY) CAPS Take 1 capsule by mouth daily.     propranolol (INDERAL) 10 MG tablet Take 10 mg by mouth 2 (two) times daily.     Current Facility-Administered Medications on File Prior to Visit  Medication Dose Route Frequency Provider Last Rate Last Admin   gadopentetate dimeglumine (MAGNEVIST) injection 14 mL  14 mL Intravenous Once PRN Penumalli, Vikram R, MD       gadopentetate dimeglumine (MAGNEVIST) injection 14 mL  14 mL Intravenous Once PRN Penumalli, Glenford Bayley, MD        Past Surgical History:  Procedure Laterality Date   ROBOTIC ASSISTED TOTAL HYSTERECTOMY WITH SALPINGECTOMY Bilateral 12/25/2015   @[facility]  by dr Henderson Cloud    No Known Allergies  BP 126/82  Ht 5' 5.5" (1.664 m)   Wt 146 lb (66.2 kg)   BMI 23.93 kg/m       No data to display              No data to display              Objective:  Physical Exam:  Gen: NAD, comfortable in exam room  Bilateral feet: No gross deformity, swelling, ecchymoses Full range of motion without pain Mild tenderness to palpation throughout distal dorsal left foot Negative ant drawer and negative talar tilt.   NV intact distally.   Assessment & Plan:  1. Bilateral foot pain - worse on left.  Describes numbness, tingling in a stocking type pattern.  No symptoms in her hands.  Brief MSK u/s of dorsal left foot showed no evidence stress fracture or other abnormalities.  Will proceed with labwork to further assess for cause  of peripheral neuropathy.  Comfortable shoes/sandals.  Avoid barefoot/flat shoes.  Tylenol, aleve only if needed.

## 2023-10-10 ENCOUNTER — Ambulatory Visit (INDEPENDENT_AMBULATORY_CARE_PROVIDER_SITE_OTHER): Payer: Self-pay | Admitting: Family Medicine

## 2023-10-10 ENCOUNTER — Ambulatory Visit (HOSPITAL_BASED_OUTPATIENT_CLINIC_OR_DEPARTMENT_OTHER)
Admission: RE | Admit: 2023-10-10 | Discharge: 2023-10-10 | Disposition: A | Discharge status: Home / Self Care | Source: Ambulatory Visit | Attending: Family Medicine | Admitting: Family Medicine

## 2023-10-10 ENCOUNTER — Encounter: Payer: Self-pay | Admitting: Family Medicine

## 2023-10-10 ENCOUNTER — Other Ambulatory Visit: Payer: Self-pay

## 2023-10-10 VITALS — BP 110/82 | Ht 65.5 in | Wt 146.0 lb

## 2023-10-10 DIAGNOSIS — R079 Chest pain, unspecified: Secondary | ICD-10-CM | POA: Insufficient documentation

## 2023-10-10 DIAGNOSIS — M546 Pain in thoracic spine: Secondary | ICD-10-CM

## 2023-10-10 DIAGNOSIS — R0781 Pleurodynia: Secondary | ICD-10-CM | POA: Diagnosis not present

## 2023-10-10 DIAGNOSIS — M25552 Pain in left hip: Secondary | ICD-10-CM

## 2023-10-10 MED ORDER — PREDNISONE 10 MG PO TABS: ORAL_TABLET | ORAL | 0 refills | Status: DC

## 2023-10-10 MED ORDER — METHYLPREDNISOLONE ACETATE 40 MG/ML IJ SUSP
80.0000 mg | Freq: Once | INTRAMUSCULAR | Status: AC
  Administered 2023-10-10: 80 mg via INTRAMUSCULAR

## 2023-10-10 MED ORDER — METHYLPREDNISOLONE ACETATE 40 MG/ML IJ SUSP: 40.0000 mg | Freq: Once | INTRAMUSCULAR | Status: DC

## 2023-10-10 MED ORDER — KETOROLAC TROMETHAMINE 60 MG/2ML IM SOLN
60.0000 mg | Freq: Once | INTRAMUSCULAR | Status: AC
Start: 2023-10-10 — End: 2023-10-10
  Administered 2023-10-10: 60 mg via INTRAMUSCULAR

## 2023-10-10 MED ORDER — CYCLOBENZAPRINE HCL 10 MG PO TABS: 10.0000 mg | ORAL_TABLET | Freq: Three times a day (TID) | ORAL | 1 refills | Status: AC | PRN
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
## 2023-10-11 ENCOUNTER — Encounter: Payer: Self-pay | Admitting: Family Medicine

## 2023-10-11 NOTE — Progress Notes (Signed)
 X-rays reviewed, no acute abnormalities.  Should continue with treatment as we discussed.  MyChart message was sent

## 2023-12-03 DIAGNOSIS — Z20822 Contact with and (suspected) exposure to covid-19: Secondary | ICD-10-CM | POA: Diagnosis not present

## 2023-12-03 DIAGNOSIS — R059 Cough, unspecified: Secondary | ICD-10-CM | POA: Diagnosis not present

## 2023-12-03 DIAGNOSIS — J029 Acute pharyngitis, unspecified: Secondary | ICD-10-CM | POA: Diagnosis not present

## 2023-12-21 DIAGNOSIS — R519 Headache, unspecified: Secondary | ICD-10-CM | POA: Diagnosis not present

## 2023-12-21 DIAGNOSIS — G43809 Other migraine, not intractable, without status migrainosus: Secondary | ICD-10-CM | POA: Diagnosis not present

## 2023-12-21 DIAGNOSIS — Z6824 Body mass index (BMI) 24.0-24.9, adult: Secondary | ICD-10-CM | POA: Diagnosis not present

## 2023-12-21 DIAGNOSIS — Z79899 Other long term (current) drug therapy: Secondary | ICD-10-CM | POA: Diagnosis not present

## 2023-12-21 DIAGNOSIS — J018 Other acute sinusitis: Secondary | ICD-10-CM | POA: Diagnosis not present

## 2023-12-22 DIAGNOSIS — E559 Vitamin D deficiency, unspecified: Secondary | ICD-10-CM | POA: Diagnosis not present

## 2023-12-22 DIAGNOSIS — Z0000 Encounter for general adult medical examination without abnormal findings: Secondary | ICD-10-CM | POA: Diagnosis not present

## 2023-12-22 DIAGNOSIS — Z6824 Body mass index (BMI) 24.0-24.9, adult: Secondary | ICD-10-CM | POA: Diagnosis not present

## 2023-12-22 DIAGNOSIS — R7302 Impaired glucose tolerance (oral): Secondary | ICD-10-CM | POA: Diagnosis not present

## 2023-12-22 DIAGNOSIS — R0609 Other forms of dyspnea: Secondary | ICD-10-CM | POA: Diagnosis not present

## 2023-12-22 DIAGNOSIS — E7800 Pure hypercholesterolemia, unspecified: Secondary | ICD-10-CM | POA: Diagnosis not present

## 2023-12-27 DIAGNOSIS — Z6824 Body mass index (BMI) 24.0-24.9, adult: Secondary | ICD-10-CM | POA: Diagnosis not present

## 2023-12-27 DIAGNOSIS — M25552 Pain in left hip: Secondary | ICD-10-CM | POA: Diagnosis not present

## 2023-12-27 DIAGNOSIS — E7800 Pure hypercholesterolemia, unspecified: Secondary | ICD-10-CM | POA: Diagnosis not present

## 2023-12-27 DIAGNOSIS — J018 Other acute sinusitis: Secondary | ICD-10-CM | POA: Diagnosis not present

## 2023-12-27 DIAGNOSIS — M79644 Pain in right finger(s): Secondary | ICD-10-CM | POA: Diagnosis not present

## 2024-01-06 DIAGNOSIS — Z6820 Body mass index (BMI) 20.0-20.9, adult: Secondary | ICD-10-CM | POA: Diagnosis not present

## 2024-01-06 DIAGNOSIS — Z780 Asymptomatic menopausal state: Secondary | ICD-10-CM | POA: Diagnosis not present

## 2024-01-06 DIAGNOSIS — R635 Abnormal weight gain: Secondary | ICD-10-CM | POA: Diagnosis not present

## 2024-01-06 DIAGNOSIS — Z124 Encounter for screening for malignant neoplasm of cervix: Secondary | ICD-10-CM | POA: Diagnosis not present

## 2024-01-06 DIAGNOSIS — E7800 Pure hypercholesterolemia, unspecified: Secondary | ICD-10-CM | POA: Diagnosis not present

## 2024-01-10 DIAGNOSIS — Z780 Asymptomatic menopausal state: Secondary | ICD-10-CM | POA: Diagnosis not present

## 2024-01-10 LAB — HM DEXA SCAN: HM Dexa Scan: -2.9

## 2024-01-11 DIAGNOSIS — J019 Acute sinusitis, unspecified: Secondary | ICD-10-CM | POA: Diagnosis not present

## 2024-01-11 DIAGNOSIS — Z20822 Contact with and (suspected) exposure to covid-19: Secondary | ICD-10-CM | POA: Diagnosis not present

## 2024-01-13 DIAGNOSIS — M25652 Stiffness of left hip, not elsewhere classified: Secondary | ICD-10-CM | POA: Diagnosis not present

## 2024-01-13 DIAGNOSIS — R29898 Other symptoms and signs involving the musculoskeletal system: Secondary | ICD-10-CM | POA: Diagnosis not present

## 2024-01-20 DIAGNOSIS — M25652 Stiffness of left hip, not elsewhere classified: Secondary | ICD-10-CM | POA: Diagnosis not present

## 2024-01-20 DIAGNOSIS — M25552 Pain in left hip: Secondary | ICD-10-CM | POA: Diagnosis not present

## 2024-01-20 DIAGNOSIS — R29898 Other symptoms and signs involving the musculoskeletal system: Secondary | ICD-10-CM | POA: Diagnosis not present

## 2024-01-26 DIAGNOSIS — M25652 Stiffness of left hip, not elsewhere classified: Secondary | ICD-10-CM | POA: Diagnosis not present

## 2024-01-26 DIAGNOSIS — M25552 Pain in left hip: Secondary | ICD-10-CM | POA: Diagnosis not present

## 2024-01-26 DIAGNOSIS — R29898 Other symptoms and signs involving the musculoskeletal system: Secondary | ICD-10-CM | POA: Diagnosis not present

## 2024-01-27 DIAGNOSIS — J45990 Exercise induced bronchospasm: Secondary | ICD-10-CM | POA: Diagnosis not present

## 2024-01-27 DIAGNOSIS — M810 Age-related osteoporosis without current pathological fracture: Secondary | ICD-10-CM | POA: Diagnosis not present

## 2024-01-27 DIAGNOSIS — I10 Essential (primary) hypertension: Secondary | ICD-10-CM | POA: Diagnosis not present

## 2024-01-27 DIAGNOSIS — H6121 Impacted cerumen, right ear: Secondary | ICD-10-CM | POA: Diagnosis not present

## 2024-01-31 DIAGNOSIS — J309 Allergic rhinitis, unspecified: Secondary | ICD-10-CM | POA: Diagnosis not present

## 2024-02-01 DIAGNOSIS — M25652 Stiffness of left hip, not elsewhere classified: Secondary | ICD-10-CM | POA: Diagnosis not present

## 2024-02-01 DIAGNOSIS — M25552 Pain in left hip: Secondary | ICD-10-CM | POA: Diagnosis not present

## 2024-02-01 DIAGNOSIS — R29898 Other symptoms and signs involving the musculoskeletal system: Secondary | ICD-10-CM | POA: Diagnosis not present

## 2024-02-15 DIAGNOSIS — M25552 Pain in left hip: Secondary | ICD-10-CM | POA: Diagnosis not present

## 2024-02-15 DIAGNOSIS — R29898 Other symptoms and signs involving the musculoskeletal system: Secondary | ICD-10-CM | POA: Diagnosis not present

## 2024-02-15 DIAGNOSIS — M25652 Stiffness of left hip, not elsewhere classified: Secondary | ICD-10-CM | POA: Diagnosis not present

## 2024-02-22 DIAGNOSIS — R29898 Other symptoms and signs involving the musculoskeletal system: Secondary | ICD-10-CM | POA: Diagnosis not present

## 2024-02-22 DIAGNOSIS — M25652 Stiffness of left hip, not elsewhere classified: Secondary | ICD-10-CM | POA: Diagnosis not present

## 2024-02-29 ENCOUNTER — Telehealth: Payer: Self-pay | Admitting: Physical Medicine and Rehabilitation

## 2024-02-29 NOTE — Telephone Encounter (Signed)
Patient was here. Would like an appointment with Dr. Ernestina Patches

## 2024-03-05 DIAGNOSIS — M25652 Stiffness of left hip, not elsewhere classified: Secondary | ICD-10-CM | POA: Diagnosis not present

## 2024-03-05 DIAGNOSIS — R29898 Other symptoms and signs involving the musculoskeletal system: Secondary | ICD-10-CM | POA: Diagnosis not present

## 2024-03-05 DIAGNOSIS — M25552 Pain in left hip: Secondary | ICD-10-CM | POA: Diagnosis not present

## 2024-03-19 ENCOUNTER — Ambulatory Visit (INDEPENDENT_AMBULATORY_CARE_PROVIDER_SITE_OTHER): Admitting: Physical Medicine and Rehabilitation

## 2024-03-19 ENCOUNTER — Encounter: Payer: Self-pay | Admitting: Physical Medicine and Rehabilitation

## 2024-03-19 ENCOUNTER — Other Ambulatory Visit (INDEPENDENT_AMBULATORY_CARE_PROVIDER_SITE_OTHER): Payer: Self-pay

## 2024-03-19 DIAGNOSIS — M5442 Lumbago with sciatica, left side: Secondary | ICD-10-CM | POA: Diagnosis not present

## 2024-03-19 DIAGNOSIS — M5416 Radiculopathy, lumbar region: Secondary | ICD-10-CM

## 2024-03-19 DIAGNOSIS — G8929 Other chronic pain: Secondary | ICD-10-CM | POA: Diagnosis not present

## 2024-03-19 DIAGNOSIS — M25552 Pain in left hip: Secondary | ICD-10-CM | POA: Diagnosis not present

## 2024-03-19 NOTE — Progress Notes (Unsigned)
 Shelley Armstrong - 53 y.o. female MRN [id number]  Date of birth: [birth year]  Office Visit Note: Visit Date: 03/19/2024 PCP: Claudene Round, MD Referred by: Claudene Round, MD  Subjective: Chief Complaint  Patient presents with   Lower Back - Pain   HPI: Shelley Armstrong is a 53 y.o. female who comes in today for evaluation of chronic, worsening and severe left sided lower back pain radiating to hip and medial aspect of thigh. Pain ongoing for several months. We have seen her in the past for thoracic back pain/myofascial pain. Her pain is constant, describes as pulling and tightness sensation, currently rates as 5 out of 10. Some relief of pain with home exercise regimen, rest and use of medications. She recently completed short course of formal physical therapy. She continues with strength training twice weekly. She is avid runner. Lumbar MRI imaging from 2020 shows borderline left subarticular lateral recess stenosis at L4-L5 due to disc bulge and mild facet arthropathy. No severe nerve impingement. No high grade spinal canal stenosis. No history of lumbar surgery. Patient denies focal weakness, numbness and tingling. No recent trauma or falls.   Of note, she is currently being treated by Dr. Carlin Fonder with [facility]. Recent left hip radiographs shows bilateral joint space preservation, no obvious CAM deformity. She underwent left greater trochanter injection with Dr. Fonder in July of this year, some relief of pain for about 3 weeks. She was previously managed for same left hip issue with Dr. Venetia Evans with [facility]. She underwent numerous left greater trochanter injections with Dr. Evans, good relief of pain with these injections. No recent left hip MRI imaging that I can locate in her chart.   She does have osteoporosis, currently being treated by her primary care provider.       Review of Systems  Musculoskeletal:  Positive for back pain and myalgias.   Neurological:  Negative for tingling, sensory change, focal weakness and weakness.  All other systems reviewed and are negative.  Otherwise per HPI.  Assessment & Plan: Visit Diagnoses:    ICD-10-CM   1. Chronic left-sided low back pain with left-sided sciatica  M54.42 MR LUMBAR SPINE WO CONTRAST   G89.29     2. Lumbar radiculopathy  M54.16 XR Lumbar Spine 2-3 Views    MR LUMBAR SPINE WO CONTRAST    3. Pain in left hip  M25.552 MR LUMBAR SPINE WO CONTRAST       Plan: Findings:  Chronic, worsening and severe left sided lower back pain radiating to lateral hip and medial aspect of left leg. She continues to have pain despite good conservative therapies such as formal physical therapy, home exercise regimen, rest and use of medications. Patients clinical presentation and exam are complex. Her symptoms today do seem to be more radicular in nature. I obtained lumbar radiographs in the office today that show normal segmentation and alignment, mild multi level degenerative changes. No spondylolisthesis. We discussed treatment plan in detail today. Next step is to place order for lumbar MRI imaging. Depending on results of MRI imaging we discussed possibility of performing lumbar epidural steroid injection. I instructed patient to follow up with Dr. Fonder for continued management of left hip pain. She has no questions at this time. No red flag symptoms noted upon exam today.     Meds & Orders: No orders of the defined types were placed in this encounter.   Orders Placed This Encounter  Procedures  XR Lumbar Spine 2-3 Views   MR LUMBAR SPINE WO CONTRAST    Follow-up: Return for Lumbar MRI review.   Procedures: No procedures performed      Clinical History: T7-8: The patient has a very shallow broad-based right paracentral protrusion which is new since the prior exam. No stenosis.   Except as noted above, intervertebral discs are unremarkable. The central canal and foramina are widely  patent throughout.   IMPRESSION: No finding to explain the patient's symptoms.   No change in a shallow central protrusion at T5-6 without central canal or foraminal narrowing.   Very shallow broad-based right paracentral protrusion at T7-8 is new since the prior MRI but the central canal and foramina are widely patent.     Electronically Signed   By: Debby Prader M.D.   On: 08/25/2018 14:49   She reports that she has never smoked. She has never used smokeless tobacco.  Recent Labs    09/28/23 1648  HGBA1C 5.6    Objective:  VS:  HT:    WT:   BMI:     BP:   HR: bpm  TEMP: ( )  RESP:  Physical Exam Vitals and nursing note reviewed.  HENT:     Head: Normocephalic and atraumatic.     Right Ear: External ear normal.     Left Ear: External ear normal.     Nose: Nose normal.     Mouth/Throat:     Mouth: Mucous membranes are moist.  Eyes:     Extraocular Movements: Extraocular movements intact.  Cardiovascular:     Rate and Rhythm: Normal rate.     Pulses: Normal pulses.  Pulmonary:     Effort: Pulmonary effort is normal.  Abdominal:     General: Abdomen is flat. There is no distension.  Musculoskeletal:        General: Tenderness present.     Cervical back: Normal range of motion.     Comments: Patient rises from seated position to standing without difficulty. Good lumbar range of motion. No pain noted with facet loading. 5/5 strength noted with bilateral hip flexion, knee flexion/extension, ankle dorsiflexion/plantarflexion and EHL. No clonus noted bilaterally. Pain upon palpation of left greater trochanter. Pain noted with internal rotation of left hip. Sensation intact bilaterally. Myofascial tenderness noted upon palpation of left thoracic and lumbar paraspinal regions. Negative slump test bilaterally. Ambulates without aid, gait steady.     Skin:    General: Skin is warm and dry.     Capillary Refill: Capillary refill takes less than 2 seconds.   Neurological:     General: No focal deficit present.     Mental Status: She is alert and oriented to person, place, and time.  Psychiatric:        Mood and Affect: Mood normal.        Behavior: Behavior normal.     Ortho Exam  Imaging: XR Lumbar Spine 2-3 Views Result Date: 03/19/2024 AP and lateral radiographs of lumbar spine show normal segmentation and alignment, mild multi level degenerative changes, no spondylolisthesis. No fractures or dislocations.    Past Medical/Family/Surgical/Social History: Medications & Allergies reviewed per EMR, new medications updated. 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   Back pain 04/21/2017   Low back pain radiating to right leg 02/15/2017   Hereditary and idiopathic peripheral neuropathy 04/19/2016   Long term use of drug 08/13/2014   Major depression, recurrent (HCC) 10/24/2013   Generalized anxiety disorder 02/27/2013   Hyperlipidemia 12/02/2012   Past Medical History:  Diagnosis Date   Allergic rhinitis    Asthma, exercise induced    followed by pcp;   Chronic bilateral thoracic back pain    DDD (degenerative disc disease), thoracic    GAD (generalized anxiety disorder)    Heart rate fast    03-28-2023  per pt hx fast heart rate w/ anxiety,  no palpitations, or dx tachycardia,  take propanolol daily   Hemorrhoids    symptomatic--- external and prolapsed internal   Hyperlipidemia    IDA (iron deficiency anemia)    MDD (major depressive  disorder)    Mild obstructive sleep apnea    03-28-2023  per pt had study done many yrs ago, told very mild osa, no cpap   Slow transit constipation    Wears glasses    Family History  Problem Relation Age of Onset   Cancer Father    Alzheimer's disease Father    Multiple sclerosis Brother    Past Surgical History:  Procedure Laterality Date   ROBOTIC ASSISTED TOTAL HYSTERECTOMY WITH SALPINGECTOMY Bilateral 12/25/2015   @[facility]  by dr curlene   Social History   Occupational History    Comment: home maker  Tobacco Use   Smoking status: Never   Smokeless tobacco: Never  Vaping Use   Vaping status: Never Used  Substance and Sexual Activity   Alcohol use: No   Drug use: Never   Sexual activity: Not on file

## 2024-03-19 NOTE — Progress Notes (Unsigned)
 Pain Scale   Average Pain 5 Patient advising she has chronic lower back pain radiating to left hip. Pain is constant without relief        +Driver, -BT, -Dye Allergies.

## 2024-03-23 DIAGNOSIS — Z6820 Body mass index (BMI) 20.0-20.9, adult: Secondary | ICD-10-CM | POA: Diagnosis not present

## 2024-03-23 DIAGNOSIS — R21 Rash and other nonspecific skin eruption: Secondary | ICD-10-CM | POA: Diagnosis not present

## 2024-03-23 DIAGNOSIS — J45990 Exercise induced bronchospasm: Secondary | ICD-10-CM | POA: Diagnosis not present

## 2024-03-23 DIAGNOSIS — Z1231 Encounter for screening mammogram for malignant neoplasm of breast: Secondary | ICD-10-CM | POA: Diagnosis not present

## 2024-03-23 DIAGNOSIS — E7800 Pure hypercholesterolemia, unspecified: Secondary | ICD-10-CM | POA: Diagnosis not present

## 2024-03-26 ENCOUNTER — Other Ambulatory Visit (HOSPITAL_BASED_OUTPATIENT_CLINIC_OR_DEPARTMENT_OTHER): Payer: Self-pay | Admitting: Physician Assistant

## 2024-03-26 DIAGNOSIS — Z1231 Encounter for screening mammogram for malignant neoplasm of breast: Secondary | ICD-10-CM

## 2024-03-27 ENCOUNTER — Ambulatory Visit
Admission: RE | Admit: 2024-03-27 | Discharge: 2024-03-27 | Disposition: A | Discharge status: Home / Self Care | Source: Ambulatory Visit | Attending: Physical Medicine and Rehabilitation | Admitting: Physical Medicine and Rehabilitation

## 2024-03-27 DIAGNOSIS — M5117 Intervertebral disc disorders with radiculopathy, lumbosacral region: Secondary | ICD-10-CM | POA: Diagnosis not present

## 2024-03-27 DIAGNOSIS — M5416 Radiculopathy, lumbar region: Secondary | ICD-10-CM

## 2024-03-27 DIAGNOSIS — G8929 Other chronic pain: Secondary | ICD-10-CM

## 2024-03-27 DIAGNOSIS — M4727 Other spondylosis with radiculopathy, lumbosacral region: Secondary | ICD-10-CM | POA: Diagnosis not present

## 2024-03-27 DIAGNOSIS — M25552 Pain in left hip: Secondary | ICD-10-CM

## 2024-03-28 ENCOUNTER — Ambulatory Visit (INDEPENDENT_AMBULATORY_CARE_PROVIDER_SITE_OTHER): Admitting: Internal Medicine

## 2024-03-28 ENCOUNTER — Encounter: Payer: Self-pay | Admitting: Internal Medicine

## 2024-03-28 VITALS — BP 126/76 | HR 101 | Temp 97.7°F | Resp 24 | Ht 65.0 in | Wt 147.6 lb

## 2024-03-28 DIAGNOSIS — J3089 Other allergic rhinitis: Secondary | ICD-10-CM | POA: Diagnosis not present

## 2024-03-28 DIAGNOSIS — M791 Myalgia, unspecified site: Secondary | ICD-10-CM | POA: Diagnosis not present

## 2024-03-28 DIAGNOSIS — J4530 Mild persistent asthma, uncomplicated: Secondary | ICD-10-CM | POA: Diagnosis not present

## 2024-03-28 DIAGNOSIS — Z880 Allergy status to penicillin: Secondary | ICD-10-CM | POA: Diagnosis not present

## 2024-03-28 DIAGNOSIS — Z888 Allergy status to other drugs, medicaments and biological substances status: Secondary | ICD-10-CM

## 2024-03-28 MED ORDER — MONTELUKAST SODIUM 10 MG PO TABS: 10.0000 mg | ORAL_TABLET | Freq: Every day | ORAL | 1 refills | Status: DC

## 2024-03-28 MED ORDER — AIRSUPRA 90-80 MCG/ACT IN AERO: 2.0000 | INHALATION_SPRAY | RESPIRATORY_TRACT | 5 refills | Status: DC | PRN

## 2024-03-28 MED ORDER — CROMOLYN SODIUM 4 % OP SOLN: 1.0000 [drp] | Freq: Four times a day (QID) | OPHTHALMIC | 12 refills | Status: AC

## 2024-03-28 MED ORDER — BUDESONIDE-FORMOTEROL FUMARATE 80-4.5 MCG/ACT IN AERO: 2.0000 | INHALATION_SPRAY | Freq: Two times a day (BID) | RESPIRATORY_TRACT | 12 refills | Status: DC

## 2024-03-28 NOTE — Progress Notes (Unsigned)
 NEW PATIENT Date of Service/Encounter:  03/28/24 Referring provider: Claudene Round, MD Primary care provider: Claudene Round, MD  Subjective:  Shelley Armstrong is a 53 y.o. female  presenting today for evaluation of asthma, rhinitis, environmental allergies  History obtained from: chart review and patient.   Discussed the use of AI scribe software for clinical note transcription with the patient, who gave verbal consent to proceed.  History of Present Illness Shelley Armstrong is a 53 year old female with exercise-induced asthma and environmental allergies who presents for evaluation of asthma symptoms and allergy management.  Asthma symptoms and triggers - Asthma symptoms present since childhood, primarily triggered by exercise, cold air, and illness - Symptoms include chest and neck tightness - Nocturnal symptoms absent - No emergency care or hospitalizations for asthma in the past year - Asthma symptoms worsen during allergy flares - Currently training for a half marathon and experiences exercise-induced asthma symptoms - Does not pre-treat with albuterol before exercise, but uses it post-exercise if needed  Asthma medication use and response - Uses albuterol as needed, approximately once daily, especially with exercise; effective after two puffs - Previously used Qvar and Trelegy; had issues with powder form of Trelegy - Prefers spray inhalers over powder forms - Has been prescribed Qvar in the past which did not help. - Husband had leftover Breztri and Trelegy at home which she tried.  Did not tolerate the powder of the Trelegy although she felt like it worked better than the Breztri.  Systemic corticosteroid use - Used prednisone  four times this past summer for sinus issues, not lower respiratory symptoms   Allergic rhinitis and environmental allergies - Experiences sinus pressure, congestion, itchy eyes, and headaches, particularly in the summer - Allergy symptoms worsen asthma control -  Uses Allegra D and Flonase for allergy management - History of allergy testing showing sensitivity to pollen, grasses, and dust - History of allergy shots, which were effective in the past, but symptoms have returned over the last few years - No recent allergy testing  Ocular symptoms - Experiences dry eyes, possibly due to sleeping with a fan blowing directly on her face - Uses eye drops for relief  Medication intolerances and adverse reactions - Experienced leg cramps with statins - Experienced diarrhea with amoxicillin - Previously used Singulair and Zyrtec without significant relief  Cardiac symptoms - Takes propranolol for heart racing      Other allergy screening: Asthma: yes Rhino conjunctivitis: yes Food allergy: no Medication allergy: yes Hymenoptera allergy: no Urticaria: no Eczema:no History of recurrent infections suggestive of immunodeficency: no Vaccinations are up to date.   Past Medical History: Past Medical History:  Diagnosis Date   Allergic rhinitis    Asthma, exercise induced    followed by pcp;   Chronic bilateral thoracic back pain    DDD (degenerative disc disease), thoracic    GAD (generalized anxiety disorder)    Heart rate fast    03-28-2023  per pt hx fast heart rate w/ anxiety,  no palpitations, or dx tachycardia,  take propanolol daily   Hemorrhoids    symptomatic--- external and prolapsed internal   Hyperlipidemia    IDA (iron deficiency anemia)    MDD (major depressive disorder)    Mild obstructive sleep apnea    03-28-2023  per pt had study done many yrs ago, told very mild osa, no cpap   Slow transit constipation    Wears glasses    Medication List:  Current Outpatient Medications  Medication  Sig Dispense Refill   Albuterol Sulfate (PROAIR HFA IN) Inhale 1-2 puffs into the lungs every 6 (six) hours.     Cholecalciferol (VITAMIN D-3) 25 MCG (1000 UT) CAPS Take 1 capsule by mouth daily.     cyclobenzaprine  (FLEXERIL ) 10 MG  tablet Take 1 tablet (10 mg total) by mouth 3 (three) times daily as needed for muscle spasms. 60 tablet 1   ferrous sulfate 324 MG TBEC Take 324 mg by mouth daily.     fexofenadine (ALLEGRA) 180 MG tablet Take 180 mg by mouth.     fluticasone (FLONASE) 50 MCG/ACT nasal spray Place 1 spray into both nostrils 2 (two) times daily.     ibuprofen (ADVIL,MOTRIN) 200 MG tablet Take 200 mg by mouth every 6 (six) hours as needed for fever, headache or mild pain.     linaclotide  (LINZESS ) 145 MCG CAPS capsule Take 1 capsule (145 mcg total) by mouth daily before breakfast. 30 capsule 1   Multiple Vitamin (MULTIVITAMIN WITH MINERALS) TABS tablet Take 1 tablet by mouth daily.     OVER THE COUNTER MEDICATION Take by mouth as needed. Calm aid, lavendar as needed     OVER THE COUNTER MEDICATION Take 1 capsule by mouth at bedtime. estevera     phentermine 15 MG capsule Take 15 mg by mouth daily.     Probiotic Product (PROBIOTIC DAILY) CAPS Take 1 capsule by mouth daily.     propranolol (INDERAL) 10 MG tablet Take 10 mg by mouth 2 (two) times daily.     alendronate (FOSAMAX) 70 MG tablet Take 70 mg by mouth once a week.     ondansetron  (ZOFRAN -ODT) 4 MG disintegrating tablet Take 2 mg by mouth every 8 (eight) hours as needed.     No current facility-administered medications for this visit.   Facility-Administered Medications Ordered in Other Visits  Medication Dose Route Frequency Provider Last Rate Last Admin   gadopentetate dimeglumine  (MAGNEVIST ) injection 14 mL  14 mL Intravenous Once PRN Penumalli, Vikram R, MD       gadopentetate dimeglumine  (MAGNEVIST ) injection 14 mL  14 mL Intravenous Once PRN Penumalli, Vikram R, MD       Known Allergies:  Allergies  Allergen Reactions   Amoxicillin    Statins    Past Surgical History: Past Surgical History:  Procedure Laterality Date   ROBOTIC ASSISTED TOTAL HYSTERECTOMY WITH SALPINGECTOMY Bilateral 12/25/2015   @[facility]  by dr curlene   Family  History: Family History  Problem Relation Age of Onset   Cancer Father    Alzheimer's disease Father    Multiple sclerosis Brother    Social History: Raivyn lives single-family home that was built in 1997.  No water damage.  Wood flooring throughout.  Heat pump.  Central cooling.  1 cat with access to bedroom.  No roaches in the house and best to be off floor.  No dust mite precautions.  No tobacco smokes.  Works as a Conservation officer, [employer].  ROS:  All other systems negative except as noted per HPI.  Objective:  Blood pressure 126/76, pulse (!) 101, temperature 97.7 F (36.5 C), temperature source Oral, resp. rate (!) 24, height 5' 5 (1.651 m), weight 147 lb 9.6 oz (67 kg), SpO2 98%. Body mass index is 24.56 kg/m. Physical Exam:  General Appearance:  Alert, cooperative, no distress, appears stated age  Head:  Normocephalic, without obvious abnormality, atraumatic  Eyes:  Conjunctiva clear, EOM's intact  Ears EACs normal bilaterally and normal TMs bilaterally  Nose: Nares  normal, deviated septum, erythematous nasal mucosa  and no visible anterior polyps  Throat: Lips, tongue normal; teeth and gums normal, normal posterior oropharynx  Neck: Supple, symmetrical  Lungs:   clear to auscultation bilaterally, Respirations unlabored, no coughing  Heart:  regular rate and rhythm and no murmur, Appears well perfused  Extremities: No edema  Skin: Skin color, texture, turgor normal and no rashes or lesions on visualized portions of skin  Neurologic: No gross deficits   Diagnostics: Spirometry:  Tracings reviewed. Her effort: Good reproducible efforts. FVC: 3.94L (pre),  FEV1: 3.04L, 111% predicted (pre),  FEV1/FVC ratio: 77 (pre),  Interpretation: Spirometry consistent with normal pattern.  Please see scanned spirometry results for details.   Labs:  Lab Orders  No laboratory test(s) ordered today     Assessment and Plan  Assessment and Plan Assessment & Plan Asthma with exercise-induced and  environmental triggers Asthma exacerbated by exercise, cold air, strong odors, and allergies. Daily symptoms with frequent albuterol use. Prefers spray inhalers. - your lung testing today looked great  - Controller Inhaler: Start Symbicort 80mcg  2 puffs twice a day; This Should Be Used Everyday - Rinse mouth out after use - During respiratory illness or asthma flares: Increase Symbicort 4 puffs  and continue for 2 weeks or until symptoms resolve. - Rescue Inhaler: Airsupra 2 puffs. Use  every 4-6 hours as needed for chest tightness, wheezing, or coughing.  Can also use 15 minutes prior to exercise if you have symptoms with activity. - Asthma is not controlled if:  - Symptoms are occurring >2 times a week OR  - >2 times a month nighttime awakenings  - You are requiring systemic steroids (prednisone /steroid injections) more than once per year  - Your require hospitalization for your asthma.  - Please call the clinic to schedule a follow up if these symptoms arise   Allergic rhinitis with seasonal exacerbation Seasonal allergies with sinus pressure, congestion, itchy eyes, and headaches. Advised against regular Allegra D use due to side effects. - Discontinue regular use of Allegra D. - Continue regular Allegra without D component. - Start montelukast 10mg  nightly  - Continue flonase  1 spray per nostril twice daily. Aim upward and outward  - Start cromolyn eye drops: 1 drop per eye up to 4 times a day as needed  - Consider allergy injections if indicated by allergy testing  - Move fan away from face during sleeping   Follow up: for allergy testing   - hold all allergy testing (1-55), including allegra    This note in its entirety was forwarded to the Provider who requested this consultation.  Other: reviewed spirometry technique and reviewed inhaler technique  Thank you for your kind referral. I appreciate the opportunity to take part in Meeka's care. Please do not hesitate to contact  me with questions.  Sincerely,  Thank you so much for letting me partake in your care today.  Don't hesitate to reach out if you have any additional concerns!  Hargis Springer, MD  [facility] [place], [place]

## 2024-03-28 NOTE — Patient Instructions (Signed)
 Asthma with exercise-induced and environmental triggers Asthma exacerbated by exercise, cold air, strong odors, and allergies. Daily symptoms with frequent albuterol use. Prefers spray inhalers. - your lung testing today looked great  - Controller Inhaler: Start Symbicort 80mcg  2 puffs twice a day; This Should Be Used Everyday - Rinse mouth out after use - will give spacer to you at testing appointment  - During respiratory illness or asthma flares: Increase Symbicort 4 puffs  and continue for 2 weeks or until symptoms resolve. - Rescue Inhaler: Airsupra 2 puffs. Use  every 4-6 hours as needed for chest tightness, wheezing, or coughing.  Can also use 15 minutes prior to exercise if you have symptoms with activity. - Asthma is not controlled if:  - Symptoms are occurring >2 times a week OR  - >2 times a month nighttime awakenings  - You are requiring systemic steroids (prednisone /steroid injections) more than once per year  - Your require hospitalization for your asthma.  - Please call the clinic to schedule a follow up if these symptoms arise   Allergic rhinitis with seasonal exacerbation Seasonal allergies with sinus pressure, congestion, itchy eyes, and headaches. Advised against regular Allegra D use due to side effects. - Discontinue regular use of Allegra D. - Continue regular Allegra without D component. - Start montelukast 10mg  nightly  - Continue flonase  1 spray per nostril twice daily. Aim upward and outward  - Start cromolyn eye drops: 1 drop per eye up to 4 times a day as needed  - Consider allergy injections if indicated by allergy testing  - Move fan away from face during sleeping   Follow up: for allergy testing   - hold all allergy testing (1-55), including allegra

## 2024-03-29 ENCOUNTER — Ambulatory Visit: Admitting: Internal Medicine

## 2024-03-29 ENCOUNTER — Ambulatory Visit (HOSPITAL_BASED_OUTPATIENT_CLINIC_OR_DEPARTMENT_OTHER)
Admission: RE | Admit: 2024-03-29 | Discharge: 2024-03-29 | Disposition: A | Discharge status: Home / Self Care | Source: Ambulatory Visit | Attending: Physician Assistant | Admitting: Physician Assistant

## 2024-03-29 ENCOUNTER — Encounter: Payer: Self-pay | Admitting: Internal Medicine

## 2024-03-29 ENCOUNTER — Encounter (HOSPITAL_BASED_OUTPATIENT_CLINIC_OR_DEPARTMENT_OTHER): Payer: Self-pay

## 2024-03-29 DIAGNOSIS — Z1231 Encounter for screening mammogram for malignant neoplasm of breast: Secondary | ICD-10-CM | POA: Diagnosis present

## 2024-04-02 ENCOUNTER — Encounter: Payer: Self-pay | Admitting: Physical Medicine and Rehabilitation

## 2024-04-02 ENCOUNTER — Ambulatory Visit (INDEPENDENT_AMBULATORY_CARE_PROVIDER_SITE_OTHER): Admitting: Physical Medicine and Rehabilitation

## 2024-04-02 ENCOUNTER — Telehealth: Payer: Self-pay

## 2024-04-02 DIAGNOSIS — G8929 Other chronic pain: Secondary | ICD-10-CM | POA: Diagnosis not present

## 2024-04-02 DIAGNOSIS — M5442 Lumbago with sciatica, left side: Secondary | ICD-10-CM | POA: Diagnosis not present

## 2024-04-02 DIAGNOSIS — M7918 Myalgia, other site: Secondary | ICD-10-CM | POA: Diagnosis not present

## 2024-04-02 DIAGNOSIS — M546 Pain in thoracic spine: Secondary | ICD-10-CM | POA: Diagnosis not present

## 2024-04-02 NOTE — Progress Notes (Signed)
 Shelley Armstrong - 53 y.o. female MRN [id number]  Date of birth: [birth year]  Office Visit Note: Visit Date: 04/02/2024 PCP: Cleotilde Bernardino Hutchinson, PA Referred by: Claudene Round, MD  Subjective: Chief Complaint  Patient presents with   Lower Back - Pain   HPI: Shelley Armstrong is a 53 y.o. female who comes in today for evaluation of chronic, worsening and severe left sided lower back pain radiating to hip and medial aspect of thigh. She also reports intermittent radiation of pain all the way up her back to neck. We have seen her in the past for thoracic back pain/myofascial pain. Her pain is constant, describes as pulling and tightness sensation, currently rates as 6 out of 10. Some relief of pain with home exercise regimen, rest and use of medications. She recently completed short course of formal physical therapy. She continues with strength training twice weekly. She is avid runner. Recent lumbar MRI imaging shows mild disc bulging with facet hypertrophy at L3-L4 and L4-L5 with resultant mild bilateral L3 and L4 foraminal stenosis. There is mild to moderate bilateral facet hypertrophy at L5-S1 without stenosis. No high grade spinal canal stenosis noted. Patient denies focal weakness, numbness and tingling. No recent trauma or falls.   Of note, she is currently being treated by Dr. Carlin Fonder with [facility]. Recent left hip radiographs shows bilateral joint space preservation, no obvious CAM deformity. She underwent left greater trochanter injection with Dr. Fonder in July of this year, some relief of pain for about 3 weeks. She was previously managed for same left hip issue with Dr. Venetia Evans with [facility]. She underwent numerous left greater trochanter injections with Dr. Evans, good relief of pain with these injections.   She does carry diagnosis of depression and anxiety.         Review of Systems  Musculoskeletal:  Positive for back pain and myalgias.   Neurological:  Negative for tingling, sensory change, focal weakness and weakness.  All other systems reviewed and are negative.  Otherwise per HPI.  Assessment & Plan: Visit Diagnoses:    ICD-10-CM   1. Chronic left-sided low back pain with left-sided sciatica  M54.42    G89.29     2. Myofascial pain syndrome  M79.18     3. Chronic bilateral thoracic back pain  M54.6    G89.29        Plan: Findings:  Chronic, worsening and severe left sided lower back pain radiating to hip and medial aspect of thigh. Intermittent radiation of pain up entire back to neck. She continues to have severe pain despite good conservative therapies such as formal physical therapy, home exercise regimen, rest and use of medications. I discussed recent lumbar MRI with her today using imaging and spine model. There is no severe nerve impingement, no severe arthritis, no central canal stenosis noted. Overall, I do not think her spine is the root cause of her pain. Would not recommend interventional spine procedures. She has diffuse pain to entire back and multiple areas of her body. This is a chronic ongoing issue. I did have her complete fibromyalgia screening in the office today. Her widespread pain index and symptoms severity score meet the diagnostic criteria for fibromyalgia. I briefly discussed fibromyalgia and management, including adequate sleep, consistent physical activity and stress reduction. We do not solely treat fibromyalgia in our office, I encouraged patient to speak with PCP regarding management. Could look at medications such as Cymbalta. She has no  questions at this time. No red flag symptoms noted upon exam today.   I also encouraged her to follow up with Dr. Lewanda regarding left hip issues. She did get some relief with prior left greater trochanter injection.     Meds & Orders: No orders of the defined types were placed in this encounter.  No orders of the defined types were placed in this  encounter.   Follow-up: Return if symptoms worsen or fail to improve.   Procedures: No procedures performed      Clinical History: Narrative & Impression CLINICAL DATA:  Initial evaluation for low back pain with radiation down left side for 4 months.   EXAM: MRI LUMBAR SPINE WITHOUT CONTRAST   TECHNIQUE: Multiplanar, multisequence MR imaging of the lumbar spine was performed. No intravenous contrast was administered.   COMPARISON:  Comparison made with radiograph from 03/19/2024 as well as previous MRI from 07/18/2018.   FINDINGS: Segmentation: Standard. Lowest well-formed disc space labeled the L5-S1 level.   Alignment: Physiologic with preservation of the normal lumbar lordosis. No listhesis.   Vertebrae: Vertebral body height maintained without acute or chronic fracture. Bone marrow signal intensity diffusely heterogeneous, but with no worrisome osseous lesions. No abnormal marrow edema.   Conus medullaris and cauda equina: Conus extends to the T12 level. Conus and cauda equina appear normal.   Paraspinal and other soft tissues: Unremarkable.   Disc levels:   L1-2:  Unremarkable.   L2-3:  Unremarkable.   L3-4: Disc desiccation with mild circumferential disc bulge. Left far lateral annular fissure noted. Minimal facet hypertrophy with trace joint effusions. No spinal stenosis. Mild bilateral L3 foraminal narrowing.   L4-5: Minimal annular disc bulge. Mild facet hypertrophy. No spinal stenosis. Mild bilateral L4 foraminal narrowing.   L5-S1: Minimal disc bulge. Mild to moderate bilateral facet hypertrophy. No spinal stenosis. Foramina remain patent.   IMPRESSION: 1. Mild disc bulging with facet hypertrophy at L3-4 and L4-5 with resultant mild bilateral L3 and L4 foraminal stenosis. 2. Mild to moderate bilateral facet hypertrophy at L5-S1 without stenosis.     Electronically Signed   By: Morene Hoard M.D.   On: 03/28/2024 06:18   She reports  that she has never smoked. She has never been exposed to tobacco smoke. She has never used smokeless tobacco.  Recent Labs    09/28/23 1648  HGBA1C 5.6    Objective:  VS:  HT:    WT:   BMI:     BP:   HR: bpm  TEMP: ( )  RESP:  Physical Exam Vitals reviewed.  HENT:     Head: Normocephalic and atraumatic.     Right Ear: External ear normal.     Left Ear: External ear normal.     Nose: Nose normal.     Mouth/Throat:     Mouth: Mucous membranes are moist.  Eyes:     Extraocular Movements: Extraocular movements intact.  Cardiovascular:     Rate and Rhythm: Normal rate.     Pulses: Normal pulses.  Pulmonary:     Effort: Pulmonary effort is normal.  Abdominal:     General: Abdomen is flat. There is no distension.  Musculoskeletal:        General: Tenderness present.     Cervical back: Normal range of motion.     Comments: Patient rises from seated position to standing without difficulty. Good lumbar range of motion. No pain noted with facet loading. 5/5 strength noted with bilateral hip flexion, knee flexion/extension,  ankle dorsiflexion/plantarflexion and EHL. No clonus noted bilaterally. No pain upon palpation of greater trochanters. No pain with internal/external rotation of bilateral hips. Sensation intact bilaterally. Myofascial tenderness noted upon palpation of left lumbar paraspinal region and bilateral thoracic paraspinal region today. Negative slump test bilaterally. Ambulates without aid, gait steady.     Skin:    General: Skin is warm and dry.     Capillary Refill: Capillary refill takes less than 2 seconds.  Neurological:     General: No focal deficit present.     Mental Status: She is alert and oriented to person, place, and time.  Psychiatric:        Mood and Affect: Mood normal.        Behavior: Behavior normal.     Ortho Exam  Imaging: No results found.  Past Medical/Family/Surgical/Social History: Medications & Allergies reviewed per EMR, new  medications updated. 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/02/2018   Back pain 04/21/2017   Low back pain radiating to right leg 02/15/2017   Hereditary and idiopathic peripheral neuropathy 04/19/2016   Long term use of drug 08/13/2014   Major depression, recurrent 10/24/2013   Generalized anxiety disorder 02/27/2013   Hyperlipidemia 12/02/2012   Past Medical History:  Diagnosis Date   Allergic rhinitis    Asthma, exercise induced    followed by pcp;   Chronic bilateral thoracic back pain    DDD (degenerative disc disease), thoracic    GAD (generalized anxiety disorder)    Heart rate fast    03-28-2023  per pt hx fast heart rate w/ anxiety,  no palpitations, or dx tachycardia,  take propanolol daily   Hemorrhoids    symptomatic--- external and prolapsed internal   Hyperlipidemia    IDA (iron deficiency anemia)    MDD (major depressive disorder)    Mild obstructive sleep apnea    03-28-2023  per pt had study done many yrs ago, told very mild osa, no cpap   Slow transit constipation    Wears glasses    Family History  Problem Relation Age of Onset   Cancer Father    Alzheimer's disease Father    Multiple sclerosis Brother    Past Surgical History:  Procedure Laterality Date   ROBOTIC ASSISTED TOTAL HYSTERECTOMY WITH SALPINGECTOMY Bilateral 12/25/2015   @[facility]  by dr curlene   Social History   Occupational History    Comment: home maker  Tobacco Use   Smoking status: Never    Passive  exposure: Never   Smokeless tobacco: Never  Vaping Use   Vaping status: Never Used  Substance and Sexual Activity   Alcohol use: No   Drug use: Never   Sexual activity: Not on file

## 2024-04-02 NOTE — Telephone Encounter (Signed)
 Received  medical records for Dr Lorin from [facility] placed on providers desk for review and kept [place] labeled as  [place] recods

## 2024-04-02 NOTE — Progress Notes (Signed)
 Pain Scale   Average Pain 6 Patient advising she has chronic lower to left hip pain, patient her for MRI review        +Driver, -BT, -Dye Allergies.

## 2024-04-02 NOTE — Telephone Encounter (Signed)
 Medical records from [place] placed in chart and routed to [facility] for review.

## 2024-04-03 ENCOUNTER — Ambulatory Visit (INDEPENDENT_AMBULATORY_CARE_PROVIDER_SITE_OTHER): Admitting: Family Medicine

## 2024-04-03 ENCOUNTER — Encounter: Payer: Self-pay | Admitting: Family Medicine

## 2024-04-03 VITALS — BP 118/76 | Ht 65.5 in | Wt 146.0 lb

## 2024-04-03 DIAGNOSIS — M25552 Pain in left hip: Secondary | ICD-10-CM | POA: Diagnosis not present
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
## 2024-04-04 ENCOUNTER — Other Ambulatory Visit: Payer: Self-pay | Admitting: Physician Assistant

## 2024-04-04 ENCOUNTER — Ambulatory Visit (INDEPENDENT_AMBULATORY_CARE_PROVIDER_SITE_OTHER): Admitting: Internal Medicine

## 2024-04-04 DIAGNOSIS — R928 Other abnormal and inconclusive findings on diagnostic imaging of breast: Secondary | ICD-10-CM

## 2024-04-04 DIAGNOSIS — J45909 Unspecified asthma, uncomplicated: Secondary | ICD-10-CM | POA: Diagnosis not present

## 2024-04-04 DIAGNOSIS — J3089 Other allergic rhinitis: Secondary | ICD-10-CM | POA: Diagnosis not present

## 2024-04-04 DIAGNOSIS — J45998 Other asthma: Secondary | ICD-10-CM | POA: Diagnosis not present

## 2024-04-04 NOTE — Progress Notes (Unsigned)
 Date of Service/Encounter:  04/05/24  Allergy testing appointment   Initial visit on 03/28/24, seen for rhinitis.  Please see that note for additional details.  Today reports for allergy diagnostic testing:    DIAGNOSTICS:  Skin Testing: Environmental allergy panel. Adequate positive and negative controls Results discussed with patient/family.  Airborne Adult Perc - 04/04/24 1444     Time Antigen Placed 1430    Allergen Manufacturer Jestine    Location Back    Number of Test 55    1. Control-Buffer 50% Glycerol Negative    2. Control-Histamine 2+    3. Bahia Negative    4. French Southern Territories Negative    5. Johnson Negative    6. Kentucky  Blue Negative    7. Meadow Fescue Negative    9. Timothy Negative    10. Ragweed Mix Negative    11. Cocklebur 2+    12. Plantain,  English Negative    13. Baccharis Negative    14. Dog Fennel Negative    15. Russian Thistle Negative    16. Lamb's Quarters Negative    17. Sheep Sorrell Negative    18. Rough Pigweed Negative    19. Marsh Elder, Rough Negative    20. Mugwort, Common Negative    21. Box, Elder Negative    22. Cedar, red Negative    23. Sweet Gum Negative    24. Pecan Pollen Negative    25. Pine Mix Negative    26. Walnut, Black Pollen 2+    27. Red Mulberry Negative    28. Ash Mix Negative    29. Birch Mix Negative    30. Beech American Negative    32. Hickory, White Negative    33. Maple Mix Negative    34. Oak, Guinea-Bissau Mix Negative    35. Sycamore Eastern Negative    36. Alternaria Alternata Negative    37. Cladosporium Herbarum Negative    38. Aspergillus Mix Negative    39. Penicillium Mix Negative    40. Bipolaris Sorokiniana (Helminthosporium) Negative    41. Drechslera Spicifera (Curvularia) Negative    42. Mucor Plumbeus Negative    43. Fusarium Moniliforme Negative    44. Aureobasidium Pullulans (pullulara) Negative    45. Rhizopus Oryzae Negative    46. Botrytis Cinera Negative    47. Epicoccum Nigrum  Negative    48. Phoma Betae Negative    49. Dust Mite Mix Negative    50. Cat Hair 10,000 BAU/ml Negative    51.  Dog Epithelia Negative    52. Mixed Feathers Negative    53. Horse Epithelia Negative    54. Cockroach, German Negative    55. Tobacco Leaf Negative          Intradermal - 04/04/24 1548     Time Antigen Placed 0245    Allergen Manufacturer Jestine    Location Arm    Number of Test 16    Control Negative    Bahia 2+    French Southern Territories Negative    Johnson Negative    7 Grass Negative    Ragweed Mix Negative    Weed Mix Negative    Tree Mix Negative    Mold 1 Negative    Mold 2 Negative    Mold 3 Negative    Mold 4 Negative    Mite Mix Negative    Cat Negative    Dog Negative    Cockroach Negative          Allergy  testing results were read and interpreted by myself, documented by clinical staff.  Patient provided with copy of allergy testing along with avoidance measures when indicated.   Hargis Springer, MD  [facility] 

## 2024-04-04 NOTE — Patient Instructions (Addendum)
 Asthma with exercise-induced and environmental triggers Asthma exacerbated by exercise, cold air, strong odors, and allergies. Daily symptoms with frequent albuterol use. Prefers spray inhalers. - Controller Inhaler:  Continue  Symbicort 80mcg  2 puffs twice a day; This Should Be Used Everyday - Rinse mouth out after use - will give spacer to you at testing appointment  - During respiratory illness or asthma flares: Increase Symbicort 4 puffs  and continue for 2 weeks or until symptoms resolve. - Rescue Inhaler: Airsupra 2 puffs. Use  every 4-6 hours as needed for chest tightness, wheezing, or coughing.  Can also use 15 minutes prior to exercise if you have symptoms with activity. - Asthma is not controlled if:  - Symptoms are occurring >2 times a week OR  - >2 times a month nighttime awakenings  - You are requiring systemic steroids (prednisone /steroid injections) more than once per year  - Your require hospitalization for your asthma.  - Please call the clinic to schedule a follow up if these symptoms arise   Allergic rhinitis with seasonal exacerbation Seasonal allergies with sinus pressure, congestion, itchy eyes, and headaches. Advised against regular Allegra D use due to side effects. - Allergy test (04/04/24): positive to grass,  weed, tree - Start avoidance measures  - Continue montelukast 10mg  nightly  - Continue flonase  1 spray per nostril twice daily. Aim upward and outward  - Continue cromolyn eye drops: 1 drop per eye up to 4 times a day as needed  - Consider allergy injections if interested  Follow up: 3 months   Thank you so much for letting me partake in your care today.  Don't hesitate to reach out if you have any additional concerns!  Hargis Springer, MD  [facility] [place] [place]  Reducing Pollen Exposure  The American Academy of Allergy, Asthma and Immunology suggests the following steps to reduce your exposure to pollen during allergy seasons.     Do not hang sheets or clothing out to dry; pollen may collect on these items. Do not mow lawns or spend time around freshly cut grass; mowing stirs up pollen. Keep windows closed at night.  Keep car windows closed while driving. Minimize morning activities outdoors, a time when pollen counts are usually at their highest. Stay indoors as much as possible when pollen counts or humidity is high and on windy days when pollen tends to remain in the air longer. Use air conditioning when possible.  Many air conditioners have filters that trap the pollen spores. Use a HEPA room air filter to remove pollen form the indoor air you breathe.    Follow up: for allergy testing   - hold all allergy testing (1-55), including allegra

## 2024-04-08 ENCOUNTER — Ambulatory Visit
Admission: RE | Admit: 2024-04-08 | Discharge: 2024-04-08 | Disposition: A | Discharge status: Home / Self Care | Source: Ambulatory Visit | Attending: Family Medicine | Admitting: Family Medicine

## 2024-04-08 DIAGNOSIS — M25552 Pain in left hip: Secondary | ICD-10-CM

## 2024-04-12 ENCOUNTER — Ambulatory Visit: Payer: Self-pay | Admitting: Family Medicine

## 2024-04-12 ENCOUNTER — Encounter: Payer: Self-pay | Admitting: Family Medicine

## 2024-04-12 ENCOUNTER — Ambulatory Visit (INDEPENDENT_AMBULATORY_CARE_PROVIDER_SITE_OTHER): Admitting: Family Medicine

## 2024-04-12 ENCOUNTER — Other Ambulatory Visit: Payer: Self-pay

## 2024-04-12 VITALS — BP 114/84 | Ht 65.5 in | Wt 146.0 lb

## 2024-04-12 DIAGNOSIS — M67952 Unspecified disorder of synovium and tendon, left thigh: Secondary | ICD-10-CM | POA: Diagnosis not present

## 2024-04-12 DIAGNOSIS — M1612 Unilateral primary osteoarthritis, left hip: Secondary | ICD-10-CM

## 2024-04-12 DIAGNOSIS — M51362 Other intervertebral disc degeneration, lumbar region with discogenic back pain and lower extremity pain: Secondary | ICD-10-CM

## 2024-04-12 MED ORDER — METHYLPREDNISOLONE ACETATE 40 MG/ML IJ SUSP
40.0000 mg | Freq: Once | INTRAMUSCULAR | Status: AC
  Administered 2024-04-12: 40 mg via INTRA_ARTICULAR

## 2024-04-12 NOTE — Progress Notes (Signed)
 Reviewed during visit 04/12/24.  Please see progress note from that visit for more details.

## 2024-04-12 NOTE — Patient Instructions (Signed)
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
## 2024-04-15 ENCOUNTER — Other Ambulatory Visit

## 2024-04-17 ENCOUNTER — Ambulatory Visit: Admission: RE | Admit: 2024-04-17 | Source: Ambulatory Visit

## 2024-04-17 ENCOUNTER — Ambulatory Visit
Admission: RE | Admit: 2024-04-17 | Discharge: 2024-04-17 | Disposition: A | Discharge status: Home / Self Care | Source: Ambulatory Visit | Attending: Physician Assistant | Admitting: Physician Assistant

## 2024-04-17 DIAGNOSIS — R928 Other abnormal and inconclusive findings on diagnostic imaging of breast: Secondary | ICD-10-CM | POA: Diagnosis not present

## 2024-04-17 DIAGNOSIS — R921 Mammographic calcification found on diagnostic imaging of breast: Secondary | ICD-10-CM | POA: Diagnosis not present

## 2024-04-26 ENCOUNTER — Ambulatory Visit

## 2024-04-30 ENCOUNTER — Encounter: Payer: Self-pay | Admitting: Radiology

## 2024-05-01 ENCOUNTER — Ambulatory Visit: Admitting: Family Medicine

## 2024-05-01 ENCOUNTER — Encounter: Payer: Self-pay | Admitting: Family Medicine

## 2024-05-01 VITALS — BP 118/86 | Ht 65.5 in | Wt 146.0 lb

## 2024-05-01 DIAGNOSIS — M1612 Unilateral primary osteoarthritis, left hip: Secondary | ICD-10-CM | POA: Diagnosis not present

## 2024-05-01 DIAGNOSIS — M1811 Unilateral primary osteoarthritis of first carpometacarpal joint, right hand: Secondary | ICD-10-CM | POA: Diagnosis not present

## 2024-05-01 NOTE — Progress Notes (Signed)
 DATE OF VISIT: 05/01/2024        [name] DOB: Sep 16, 1970 MRN: [id number]  Discussed the use of AI scribe software for clinical note transcription with the patient, who gave verbal consent to proceed.  History of Present Illness Shelley Armstrong is a 53 year old female who presents for follow-up of left hip pain Last seen by me 04/12/24 and underwent u/s guided intra-articular hip injection  Left hip pain - Significant improvement in arthritis symptoms following recent injection, with approximately 98% reduction in soreness. - Onset of relief began on the second or third day post-injection, with full relief achieved within three to five days.   Supplement and medication use for symptom control - Manages symptoms with over-the-counter supplements, including turmeric.  Impact on physical activity - Preparing for a race on November 15th in [place]. - Adjusted participation from half marathon to 8K due to arthritis symptoms.  Also c/o right thumb pain Hx of right thumb CMC OA Previous CSI by Dr Lewanda on 12/27/23 with good relief for about 4 months - Considering another injection due to recurrence of symptoms, but does note the injection was very painful    Medications:  Outpatient Encounter Medications as of 05/01/2024  Medication Sig   Albuterol-Budesonide (AIRSUPRA) 90-80 MCG/ACT AERO Inhale 2 puffs into the lungs every 4 (four) hours as needed (cough, wheeze, dyspnea).   alendronate (FOSAMAX) 70 MG tablet Take 70 mg by mouth once a week.   budesonide-formoterol (SYMBICORT) 80-4.5 MCG/ACT inhaler Inhale 2 puffs into the lungs 2 (two) times daily.   Cholecalciferol (VITAMIN D-3) 25 MCG (1000 UT) CAPS Take 1 capsule by mouth daily.   cromolyn (OPTICROM) 4 % ophthalmic solution Place 1 drop into both eyes 4 (four) times daily.   cyclobenzaprine  (FLEXERIL ) 10 MG tablet Take 1 tablet (10 mg total) by mouth 3 (three) times daily as needed for muscle spasms.   ferrous sulfate 324 MG TBEC Take 324  mg by mouth daily.   fexofenadine (ALLEGRA) 180 MG tablet Take 180 mg by mouth.   fluticasone (FLONASE) 50 MCG/ACT nasal spray Place 1 spray into both nostrils 2 (two) times daily.   ibuprofen (ADVIL,MOTRIN) 200 MG tablet Take 200 mg by mouth every 6 (six) hours as needed for fever, headache or mild pain.   linaclotide  (LINZESS ) 145 MCG CAPS capsule Take 1 capsule (145 mcg total) by mouth daily before breakfast.   montelukast (SINGULAIR) 10 MG tablet Take 1 tablet (10 mg total) by mouth at bedtime.   Multiple Vitamin (MULTIVITAMIN WITH MINERALS) TABS tablet Take 1 tablet by mouth daily.   ondansetron  (ZOFRAN -ODT) 4 MG disintegrating tablet Take 2 mg by mouth every 8 (eight) hours as needed.   OVER THE COUNTER MEDICATION Take by mouth as needed. Calm aid, lavendar as needed   phentermine 15 MG capsule Take 15 mg by mouth daily.   Probiotic Product (PROBIOTIC DAILY) CAPS Take 1 capsule by mouth daily.   propranolol (INDERAL) 10 MG tablet Take 10 mg by mouth 2 (two) times daily.   Facility-Administered Encounter Medications as of 05/01/2024  Medication   gadopentetate dimeglumine  (MAGNEVIST ) injection 14 mL   gadopentetate dimeglumine  (MAGNEVIST ) injection 14 mL    Allergies: is allergic to amoxicillin and statins.  Physical Examination: Vitals: BP 118/86   Ht 5' 5.5 (1.664 m)   Wt 146 lb (66.2 kg)   BMI 23.93 kg/m  GENERAL:  Shelley Armstrong is a 53 y.o. female appearing their stated age, alert and oriented x 3, in  no apparent distress.  SKIN: no rashes or lesions, skin clean, dry, intact MSK: HIP: LT hip with FROM without pain, no TTP along anterior hip or greater trochanter.  Hip strength 5-/5 today.  Neg FABER, neg FADIR.  Rt hip with FROM without pain.  Walking without a limp  HAND: RT thumb with mild TTP over CMC joint, no swelling, no increased redness or warmth N/V/I distally  Assessment & Plan Left hip pain with primary osteoarthritis of left hip Significant improvement  post-injection with 98% symptom relief. Mild soreness persists.  - Monitor symptoms and assess relief duration post-injection. - Consider repeat injection if symptoms improved for at least 47-months - Evaluate response after upcoming race for long-term efficacy - follow-up if worsening or not improving  Rt thumb CMC OA Previous corticosteroid injection provided significant relief for four months. Current symptoms mild with occasional discomfort. - Scheduled ultrasound-guided corticosteroid injection with Dr. Sharlene at [facility] office since this is more convenient. - Continue turmeric and supportive measures.       Patient expressed understanding & agreement with above.  Encounter Diagnoses  Name Primary?   Primary osteoarthritis of left hip Yes   Primary osteoarthritis of first carpometacarpal joint of right hand     No orders of the defined types were placed in this encounter.     Contains text generated by [employer].

## 2024-05-03 ENCOUNTER — Other Ambulatory Visit (INDEPENDENT_AMBULATORY_CARE_PROVIDER_SITE_OTHER): Payer: Self-pay

## 2024-05-03 ENCOUNTER — Ambulatory Visit (INDEPENDENT_AMBULATORY_CARE_PROVIDER_SITE_OTHER)

## 2024-05-03 VITALS — BP 102/80 | Ht 65.5 in | Wt 146.0 lb

## 2024-05-03 DIAGNOSIS — M1811 Unilateral primary osteoarthritis of first carpometacarpal joint, right hand: Secondary | ICD-10-CM

## 2024-05-03 MED ORDER — METHYLPREDNISOLONE ACETATE 40 MG/ML IJ SUSP
40.0000 mg | Freq: Once | INTRAMUSCULAR | Status: AC
  Administered 2024-05-03: 20 mg via INTRA_ARTICULAR

## 2024-05-03 NOTE — Addendum Note (Signed)
 Addended by: MARCINE HARLENE SAILOR on: 05/03/2024 09:20 AM   Modules accepted: Orders

## 2024-05-03 NOTE — Progress Notes (Signed)
   Subjective:    Patient ID: Shelley Armstrong, female    DOB: 53 y.o., 21-Apr-1971   MRN: [id number]  Chief Complaint: Right thumb CMC osteoarthritis  History of present illness:  Patient previously underwent corticosteroid injection which provided approximately 4 months of significant relief. She is taking turmeric as well for this. Here today for ultrasound-guided corticosteroid injection of the right first CMC.     Objective:   Vitals:   05/03/24 0831  BP: 102/80   Right wrist: Positive CMC grind.  Tenderness to palpation over the palmar aspect of the first [facility] joint.  Negative Eickhoff, negative Finkelstein.   1st [facility] Joint Injection with Ultrasound Guidance Shelley Armstrong 1971/04/02 Indications: Pain Procedure Details Following the description of risks including infection bleeding, damage to surrounding structures, patient provided verbal/written consent for Right 1st [facility] Joint injection procedure with ultrasound guidance. Ultrasound was used to identify the [facility] joint and was used to guide this procedure. Patient was sterilely prepped in the usual fashion with chlorhexidine. Sterile ultrasound gel was used for the procedure. Following topical anesthetization with ethyl chloride, the patient was injected into the joint with a solution of 20mg  Depo-Medrol  and 1cc 2% Mepivicaine. This was well visualized under ultrasound, please see associated photographic documentation. Patient tolerated well without complication. Precautions provided. Cleaned and dressing applied.      Assessment & Plan:   Assessment & Plan  I agree with the evaluation by Dr. Teressa that the patient most likely has CMC arthritis of her right thumb.  We discussed the possibility of imaging with her though this is not necessarily required given the findings on my physical exam today.  She has had excellent response to corticosteroid injection intra-articularly in the past and we will proceed with this today.  Patient tolerated  procedure well.  Follow-up as needed for this.  Discussed the possibility of hand therapy +/- bracing as future options to potentially help effect of injections last longer for her.

## 2024-05-07 DIAGNOSIS — J018 Other acute sinusitis: Secondary | ICD-10-CM | POA: Diagnosis not present

## 2024-05-07 DIAGNOSIS — Z20822 Contact with and (suspected) exposure to covid-19: Secondary | ICD-10-CM | POA: Diagnosis not present

## 2024-05-07 DIAGNOSIS — J029 Acute pharyngitis, unspecified: Secondary | ICD-10-CM | POA: Diagnosis not present

## 2024-06-04 ENCOUNTER — Telehealth: Payer: Self-pay | Admitting: Internal Medicine

## 2024-06-04 MED ORDER — AIRSUPRA 90-80 MCG/ACT IN AERO: 2.0000 | INHALATION_SPRAY | RESPIRATORY_TRACT | 0 refills | Status: DC | PRN

## 2024-06-04 MED ORDER — BUDESONIDE-FORMOTEROL FUMARATE 80-4.5 MCG/ACT IN AERO: 2.0000 | INHALATION_SPRAY | Freq: Two times a day (BID) | RESPIRATORY_TRACT | 0 refills | Status: DC

## 2024-06-04 NOTE — Telephone Encounter (Signed)
 PT REQUESTING INHALERS BE SENT TO [facility]

## 2024-06-04 NOTE — Telephone Encounter (Signed)
 Symbicort  and Airsupra  prescriptions sent to [facility].

## 2024-06-05 NOTE — Telephone Encounter (Signed)
 PT REQUESTING A 3 MNTH SUPPLY OF HER PRESCRIPTION

## 2024-06-06 ENCOUNTER — Ambulatory Visit (HOSPITAL_BASED_OUTPATIENT_CLINIC_OR_DEPARTMENT_OTHER)
Admission: RE | Admit: 2024-06-06 | Discharge: 2024-06-06 | Disposition: A | Discharge status: Home / Self Care | Source: Ambulatory Visit

## 2024-06-06 ENCOUNTER — Other Ambulatory Visit: Payer: Self-pay

## 2024-06-06 ENCOUNTER — Ambulatory Visit

## 2024-06-06 VITALS — BP 108/80 | Ht 65.5 in | Wt 146.0 lb

## 2024-06-06 DIAGNOSIS — M79671 Pain in right foot: Secondary | ICD-10-CM | POA: Diagnosis present

## 2024-06-06 DIAGNOSIS — M2141 Flat foot [pes planus] (acquired), right foot: Secondary | ICD-10-CM | POA: Diagnosis not present

## 2024-06-06 NOTE — Progress Notes (Unsigned)
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   She reports jamming it a couple days ago but she is unsure exactly how the foot moved when this happened. Pain was worst initially but has improved some since. No numbness or tingling.  Review of pertinent imaging: Three-view plain film radiographs obtained of the right foot on 06/06/2024 provide you revealing os trigonum, largely preserved joint spaces, soft tissue swelling present over the fourth digit of the right foot particularly at the level of the DIP.  No acute osseous abnormalities.     Objective:   There were no vitals filed for this visit.  Right ankle ( compared to normal ) Inspection: Mild swelling present over the fourth digit.  Prominent bruising present in the same area.  No areas of skin breakdown.  Nailbed undamaged Palpation: TTP - ATFL, - CFL, - PTFL, - anterior joint line, - navicular, - base of 5th metatarsal, - deltoid, -  tarsal, + metatarsal at fourth MTP, - achilles, - plantar fascia AROM/PROM: Slightly limited plantarflexion, dorsiflexion passively of the fourth digit 2/2 pain.  Strength testing of extension does not elicit significant pain.  Mild/minimal pain with resisted flexion testing.  FROM  of the great toe Special tests: - talar tilt test, - anterior drawer, - proximal squeeze, - calcaneal squeeze  Limited US  evaluation of the right foot: Small effusion present within the MTP joint of the fourth digit Swelling present in subcutaneous tissue Impression: Fourth MTP joint sprain    Assessment & Plan:   Assessment & Plan Shelley Armstrong most likely sustained a fourth MTP joint sprain with no obvious sign of fracture  present.  Recommend open toed shoes to relieve pressure over this area while it heals given that narrow toed shoes seem to worsen symptoms.  Placed metatarsal pad in current shoe. Can take tylenol/ibuprofen as needed for pain.  Recommend follow-up as needed.

## 2024-06-07 NOTE — Patient Instructions (Signed)

## 2024-06-07 NOTE — Progress Notes (Unsigned)
 No chief complaint on file.      New Patient Visit SUBJECTIVE: HPI: Shelley Armstrong is an 53 y.o.female who is being seen for establishing care.  The patient was previously seen at ***.  Employment: SH: Married***   Children****  Past Medical History:  Diagnosis Date   Allergic rhinitis    Asthma, exercise induced    followed by pcp;   Chronic bilateral thoracic back pain    DDD (degenerative disc disease), thoracic    GAD (generalized anxiety disorder)    Heart rate fast    03-28-2023  per pt hx fast heart rate w/ anxiety,  no palpitations, or dx tachycardia,  take propanolol daily   Hemorrhoids    symptomatic--- external and prolapsed internal   Hyperlipidemia    IDA (iron deficiency anemia)    MDD (major depressive disorder)    Mild obstructive sleep apnea    03-28-2023  per pt had study done many yrs ago, told very mild osa, no cpap   Slow transit constipation    Wears glasses    Past Surgical History:  Procedure Laterality Date   ROBOTIC ASSISTED TOTAL HYSTERECTOMY WITH SALPINGECTOMY Bilateral 12/25/2015   @[facility]  by dr curlene   Family History  Problem Relation Age of Onset   Cancer Father    Alzheimer's disease Father    Multiple sclerosis Brother    Allergies[1] Current Medications[2]  PHQ9 Today:     No data to display         GAD7 Today:     No data to display          OBJECTIVE: There were no vitals taken for this visit. General:  well developed, well nourished, in no apparent distress Skin:  no significant moles, warts, or growths Nose:  nares patent, septum midline, mucosa normal Throat/Pharynx:  lips and gingiva without lesion; tongue and uvula midline; non-inflamed pharynx; no exudates or postnasal drainage Lungs:  clear to auscultation, breath sounds equal bilaterally, no respiratory distress Cardio:  regular rate and rhythm, no LE edema or bruits Musculoskeletal:  symmetrical muscle groups noted without atrophy or deformity Neuro:  gait  normal Psych: well oriented with normal range of affect and appropriate judgment/insight  ASSESSMENT/PLAN: No diagnosis found.  Patient instructed to sign release of records form from their previous PCP. The patient voiced understanding and agreement to the plan. Education provided today during visit and on AVS for patient to review at home.  Diet and Exercise recommendations provided.  Current diagnoses and recommendations discussed. HM recommendations reviewed with recommendations.   Patient should return ***. No follow-ups on file.   Harlene LITTIE Jolly, DNP, AGNP-C 06/07/2024  8:15 AM     [1]  Allergies Allergen Reactions   Amoxicillin    Statins   [2]  Current Outpatient Medications:    Albuterol-Budesonide  (AIRSUPRA ) 90-80 MCG/ACT AERO, Inhale 2 puffs into the lungs every 4 (four) hours as needed (cough, wheeze, dyspnea)., Disp: 32.1 g, Rfl: 0   alendronate (FOSAMAX) 70 MG tablet, Take 70 mg by mouth once a week., Disp: , Rfl:    budesonide -formoterol  (SYMBICORT ) 80-4.5 MCG/ACT inhaler, Inhale 2 puffs into the lungs 2 (two) times daily., Disp: 3 each, Rfl: 0   Cholecalciferol (VITAMIN D-3) 25 MCG (1000 UT) CAPS, Take 1 capsule by mouth daily., Disp: , Rfl:    cromolyn  (OPTICROM ) 4 % ophthalmic solution, Place 1 drop into both eyes 4 (four) times daily., Disp: 10 mL, Rfl: 12   cyclobenzaprine  (FLEXERIL ) 10 MG tablet,  Take 1 tablet (10 mg total) by mouth 3 (three) times daily as needed for muscle spasms., Disp: 60 tablet, Rfl: 1   ferrous sulfate 324 MG TBEC, Take 324 mg by mouth daily., Disp: , Rfl:    fexofenadine (ALLEGRA) 180 MG tablet, Take 180 mg by mouth., Disp: , Rfl:    fluticasone (FLONASE) 50 MCG/ACT nasal spray, Place 1 spray into both nostrils 2 (two) times daily., Disp: , Rfl:    ibuprofen (ADVIL,MOTRIN) 200 MG tablet, Take 200 mg by mouth every 6 (six) hours as needed for fever, headache or mild pain., Disp: , Rfl:    linaclotide  (LINZESS ) 145 MCG CAPS  capsule, Take 1 capsule (145 mcg total) by mouth daily before breakfast., Disp: 30 capsule, Rfl: 1   montelukast  (SINGULAIR ) 10 MG tablet, Take 1 tablet (10 mg total) by mouth at bedtime., Disp: 90 tablet, Rfl: 1   Multiple Vitamin (MULTIVITAMIN WITH MINERALS) TABS tablet, Take 1 tablet by mouth daily., Disp: , Rfl:    ondansetron  (ZOFRAN -ODT) 4 MG disintegrating tablet, Take 2 mg by mouth every 8 (eight) hours as needed., Disp: , Rfl:    OVER THE COUNTER MEDICATION, Take by mouth as needed. Calm aid, lavendar as needed, Disp: , Rfl:    phentermine 15 MG capsule, Take 15 mg by mouth daily., Disp: , Rfl:    Probiotic Product (PROBIOTIC DAILY) CAPS, Take 1 capsule by mouth daily., Disp: , Rfl:    propranolol (INDERAL) 10 MG tablet, Take 10 mg by mouth 2 (two) times daily., Disp: , Rfl:  No current facility-administered medications for this visit.  Facility-Administered Medications Ordered in Other Visits:    gadopentetate dimeglumine  (MAGNEVIST ) injection 14 mL, 14 mL, Intravenous, Once PRN, Penumalli, Vikram R, MD   gadopentetate dimeglumine  (MAGNEVIST ) injection 14 mL, 14 mL, Intravenous, Once PRN, Penumalli, Vikram R, MD

## 2024-06-08 ENCOUNTER — Ambulatory Visit: Admitting: Student

## 2024-06-08 ENCOUNTER — Encounter: Payer: Self-pay | Admitting: Student

## 2024-06-08 ENCOUNTER — Ambulatory Visit: Payer: Self-pay | Admitting: Student

## 2024-06-08 VITALS — BP 112/75 | HR 90 | Ht 65.5 in | Wt 152.0 lb

## 2024-06-08 DIAGNOSIS — H6121 Impacted cerumen, right ear: Secondary | ICD-10-CM | POA: Insufficient documentation

## 2024-06-08 DIAGNOSIS — J45909 Unspecified asthma, uncomplicated: Secondary | ICD-10-CM | POA: Diagnosis not present

## 2024-06-08 DIAGNOSIS — Z0000 Encounter for general adult medical examination without abnormal findings: Secondary | ICD-10-CM | POA: Insufficient documentation

## 2024-06-08 DIAGNOSIS — M810 Age-related osteoporosis without current pathological fracture: Secondary | ICD-10-CM | POA: Diagnosis not present

## 2024-06-08 DIAGNOSIS — D509 Iron deficiency anemia, unspecified: Secondary | ICD-10-CM | POA: Diagnosis not present

## 2024-06-08 DIAGNOSIS — F411 Generalized anxiety disorder: Secondary | ICD-10-CM | POA: Diagnosis not present

## 2024-06-08 DIAGNOSIS — M25552 Pain in left hip: Secondary | ICD-10-CM

## 2024-06-08 LAB — CBC WITH DIFFERENTIAL/PLATELET
Basophils Absolute: 0 K/uL (ref 0.0–0.1)
Basophils Relative: 0.1 % (ref 0.0–3.0)
Eosinophils Absolute: 0.1 K/uL (ref 0.0–0.7)
Eosinophils Relative: 2.7 % (ref 0.0–5.0)
HCT: 42.3 % (ref 36.0–46.0)
Hemoglobin: 14 g/dL (ref 12.0–15.0)
Lymphocytes Relative: 37.7 % (ref 12.0–46.0)
Lymphs Abs: 1.7 K/uL (ref 0.7–4.0)
MCHC: 33.2 g/dL (ref 30.0–36.0)
MCV: 90.7 fl (ref 78.0–100.0)
Monocytes Absolute: 0.3 K/uL (ref 0.1–1.0)
Monocytes Relative: 7.2 % (ref 3.0–12.0)
Neutro Abs: 2.3 K/uL (ref 1.4–7.7)
Neutrophils Relative %: 52.3 % (ref 43.0–77.0)
Platelets: 206 K/uL (ref 150.0–400.0)
RBC: 4.66 Mil/uL (ref 3.87–5.11)
RDW: 13.4 % (ref 11.5–15.5)
WBC: 4.4 K/uL (ref 4.0–10.5)

## 2024-06-08 LAB — COMPREHENSIVE METABOLIC PANEL WITH GFR
ALT: 17 U/L (ref 0–35)
AST: 22 U/L (ref 0–37)
Albumin: 4.7 g/dL (ref 3.5–5.2)
Alkaline Phosphatase: 42 U/L (ref 39–117)
BUN: 11 mg/dL (ref 6–23)
CO2: 31 meq/L (ref 19–32)
Calcium: 9.7 mg/dL (ref 8.4–10.5)
Chloride: 104 meq/L (ref 96–112)
Creatinine, Ser: 0.76 mg/dL (ref 0.40–1.20)
GFR: 88.54 mL/min (ref >60.00)
Glucose, Bld: 96 mg/dL (ref 70–99)
Potassium: 4.1 meq/L (ref 3.5–5.1)
Sodium: 143 meq/L (ref 135–145)
Total Bilirubin: 0.6 mg/dL (ref 0.2–1.2)
Total Protein: 6.7 g/dL (ref 6.0–8.3)

## 2024-06-08 LAB — VITAMIN D 25 HYDROXY (VIT D DEFICIENCY, FRACTURES): VITD: 45.48 ng/mL (ref 30.00–100.00)

## 2024-06-08 LAB — LIPID PANEL
Cholesterol: 282 mg/dL — ABNORMAL HIGH (ref 0–200)
HDL: 82.3 mg/dL (ref >39.00)
LDL Cholesterol: 181 mg/dL — ABNORMAL HIGH (ref 0–99)
NonHDL: 199.53
Total CHOL/HDL Ratio: 3
Triglycerides: 92 mg/dL (ref 0.0–149.0)
VLDL: 18.4 mg/dL (ref 0.0–40.0)

## 2024-06-08 LAB — TSH: TSH: 0.77 u[IU]/mL (ref 0.35–5.50)

## 2024-06-08 NOTE — Progress Notes (Addendum)
 Subjective:     Patient ID: Shelley Armstrong, female    DOB: 1970-10-29, 53 y.o.   MRN: [id number]  Chief Complaint  Patient presents with   Establish Care    Wants to discuss restarting Wegovy    HPI  Discussed the use of AI scribe software for clinical note transcription with the patient, who gave verbal consent to proceed.       History of Present Illness         History of Present Illness Shelley Armstrong is a 53 year old female who presents for a new patient visit and annual physical. She is married and has 2 adult children. She works part-time as a conservation officer, nature at [employer].   Her asthma is managed with ProAir (albuterol) as a rescue inhaler and daily budesonide . She previously used Symbicort  but stopped. She has exercise-induced symptoms and uses albuterol before running as needed.  She had a DEXA scan earlier this year and has taken Fosamax (alendronate) for about a year along with vitamin D3. She had a total hysterectomy in 2017.  She takes propranolol twice daily for fast heart rate. She takes iron for Hx IDA.  She has chronic hip pain, follows with Sports medicine receives injections for hip issues and pinched nerves about every three months, with the last injection in July.  She does not smoke or drink alcohol.  Patient denies fever, chills, SOB, CP, palpitations, dyspnea, edema, HA, vision changes, N/V/D, abdominal pain, urinary symptoms, rash, weight changes, and recent illness or hospitalizations.   Prior PCP: [facility], records requested    Health Maintenance Due  Topic Date Due   Hepatitis C Screening  Never done   Hepatitis B Vaccines 19-59 Average Risk (1 of 3 - 19+ 3-dose series) Never done   Colonoscopy  Never done    Past Medical History:  Diagnosis Date   Allergic rhinitis    Asthma, exercise induced    followed by pcp;   Chronic bilateral thoracic back pain    DDD (degenerative disc disease), thoracic    GAD (generalized anxiety  disorder)    Heart rate fast    03-28-2023  per pt hx fast heart rate w/ anxiety,  no palpitations, or dx tachycardia,  take propanolol daily   Hemorrhoids    symptomatic--- external and prolapsed internal   Hyperlipidemia    IDA (iron deficiency anemia)    MDD (major depressive disorder)    Mild obstructive sleep apnea    03-28-2023  per pt had study done many yrs ago, told very mild osa, no cpap   Slow transit constipation    Wears glasses     Past Surgical History:  Procedure Laterality Date   ROBOTIC ASSISTED TOTAL HYSTERECTOMY WITH SALPINGECTOMY Bilateral 12/25/2015   @[facility]  by dr curlene    Family History  Problem Relation Age of Onset   Dementia Mother    Cancer Father    Alzheimer's disease Father    Colon cancer Father    Multiple sclerosis Brother    Prostate cancer Neg Hx    Breast cancer Neg Hx    Thyroid  disease Neg Hx     Social History   Socioeconomic History   Marital status: Married    Spouse name: Bill   Number of children: 2   Years of education: 14   Highest education level: Not on file  Occupational History    Comment: home maker  Tobacco Use   Smoking status: Never  Passive exposure: Never   Smokeless tobacco: Never  Vaping Use   Vaping status: Never Used  Substance and Sexual Activity   Alcohol use: No   Drug use: Never   Sexual activity: Yes    Birth control/protection: Surgical  Other Topics Concern   Not on file  Social History Narrative   08/30/18 lives with husband    caffeine -diet coke once a week   Social Drivers of Health   Tobacco Use: Low Risk (06/08/2024)   Patient History    Smoking Tobacco Use: Never    Smokeless Tobacco Use: Never    Passive Exposure: Never  Financial Resource Strain: Not on file  Food Insecurity: Not on file  Transportation Needs: Not on file  Physical Activity: Not on file  Stress: Not on file  Social Connections: Not on file  Intimate Partner Violence: Not on file  Depression (EYV7-0): Not  on file  Alcohol Screen: Not on file  Housing: Not on file  Utilities: Not on file  Health Literacy: Not on file    Outpatient Medications Prior to Visit  Medication Sig Dispense Refill   albuterol (VENTOLIN HFA) 108 (90 Base) MCG/ACT inhaler Inhale into the lungs.     Albuterol-Budesonide  (AIRSUPRA ) 90-80 MCG/ACT AERO Inhale 2 puffs into the lungs every 4 (four) hours as needed (cough, wheeze, dyspnea). 32.1 g 0   alendronate (FOSAMAX) 70 MG tablet Take 70 mg by mouth once a week.     budesonide -formoterol  (SYMBICORT ) 80-4.5 MCG/ACT inhaler Inhale 2 puffs into the lungs 2 (two) times daily. 3 each 0   Cholecalciferol (VITAMIN D-3) 25 MCG (1000 UT) CAPS Take 1 capsule by mouth daily.     cromolyn  (OPTICROM ) 4 % ophthalmic solution Place 1 drop into both eyes 4 (four) times daily. 10 mL 12   cyclobenzaprine  (FLEXERIL ) 10 MG tablet Take 1 tablet (10 mg total) by mouth 3 (three) times daily as needed for muscle spasms. 60 tablet 1   ferrous sulfate 324 MG TBEC Take 324 mg by mouth daily.     fexofenadine (ALLEGRA) 180 MG tablet Take 180 mg by mouth.     fluticasone (FLONASE) 50 MCG/ACT nasal spray Place 1 spray into both nostrils 2 (two) times daily.     ibuprofen (ADVIL,MOTRIN) 200 MG tablet Take 200 mg by mouth every 6 (six) hours as needed for fever, headache or mild pain.     montelukast  (SINGULAIR ) 10 MG tablet Take 1 tablet (10 mg total) by mouth at bedtime. 90 tablet 1   Multiple Vitamin (MULTIVITAMIN WITH MINERALS) TABS tablet Take 1 tablet by mouth daily.     ondansetron  (ZOFRAN -ODT) 4 MG disintegrating tablet Take 2 mg by mouth every 8 (eight) hours as needed.     OVER THE COUNTER MEDICATION Take by mouth as needed. Calm aid, lavendar as needed     phentermine 15 MG capsule Take 15 mg by mouth daily.     Probiotic Product (PROBIOTIC DAILY) CAPS Take 1 capsule by mouth daily.     propranolol (INDERAL) 10 MG tablet Take 10 mg by mouth 2 (two) times daily.     linaclotide  (LINZESS ) 145  MCG CAPS capsule Take 1 capsule (145 mcg total) by mouth daily before breakfast. 30 capsule 1   Facility-Administered Medications Prior to Visit  Medication Dose Route Frequency Provider Last Rate Last Admin   gadopentetate dimeglumine  (MAGNEVIST ) injection 14 mL  14 mL Intravenous Once PRN Penumalli, Vikram R, MD       gadopentetate  dimeglumine (MAGNEVIST ) injection 14 mL  14 mL Intravenous Once PRN Penumalli, Vikram R, MD        Allergies[1]  ROS See HPI    Objective:    Physical Exam Constitutional:      General: She is not in acute distress.    Appearance: She is not ill-appearing, toxic-appearing or diaphoretic.  HENT:     Head: Normocephalic and atraumatic.     Right Ear: Tympanic membrane, ear canal and external ear normal.     Left Ear: Tympanic membrane, ear canal and external ear normal.     Nose: Nose normal. No congestion.     Mouth/Throat:     Mouth: Mucous membranes are moist.     Pharynx: Oropharynx is clear.  Eyes:     Extraocular Movements: Extraocular movements intact.     Right eye: Normal extraocular motion.     Left eye: Normal extraocular motion.     Conjunctiva/sclera: Conjunctivae normal.     Pupils: Pupils are equal, round, and reactive to light.  Neck:     Thyroid : No thyroid  mass or thyromegaly.     Vascular: No carotid bruit or JVD.  Cardiovascular:     Rate and Rhythm: Normal rate and regular rhythm.     Pulses: Normal pulses.          Radial pulses are 2+ on the right side and 2+ on the left side.       Dorsalis pedis pulses are 2+ on the right side and 2+ on the left side.     Heart sounds: Normal heart sounds, S1 normal and S2 normal. No murmur heard.    No friction rub. No gallop.  Pulmonary:     Effort: Pulmonary effort is normal. No respiratory distress.     Breath sounds: Normal breath sounds.  Abdominal:     General: Bowel sounds are normal. There is no distension.     Palpations: Abdomen is soft.     Tenderness: There is no  abdominal tenderness. There is no guarding.  Musculoskeletal:        General: Normal range of motion.     Cervical back: Full passive range of motion without pain and normal range of motion. No edema or erythema.     Right lower leg: No edema.     Left lower leg: No edema.  Lymphadenopathy:     Cervical: No cervical adenopathy.  Skin:    General: Skin is warm and dry.     Capillary Refill: Capillary refill takes less than 2 seconds.  Neurological:     General: No focal deficit present.     Mental Status: She is alert and oriented to person, place, and time.     Cranial Nerves: No cranial nerve deficit.     Motor: No weakness.     Coordination: Coordination normal.     Gait: Gait normal.     Deep Tendon Reflexes: Reflexes normal.  Psychiatric:        Mood and Affect: Mood normal.        Behavior: Behavior normal.        Thought Content: Thought content normal.      BP 112/75   Pulse 90   Ht 5' 5.5 (1.664 m)   Wt 152 lb (68.9 kg)   SpO2 100%   BMI 24.91 kg/m  Wt Readings from Last 3 Encounters:  06/08/24 152 lb (68.9 kg)  06/06/24 146 lb (66.2 kg)  05/03/24 146 lb (66.2  kg)       Assessment & Plan:   Problem List Items Addressed This Visit       Respiratory   Asthma   Well-controlled with budesonide  and ProAir. No recent exacerbations.       Relevant Medications   albuterol (VENTOLIN HFA) 108 (90 Base) MCG/ACT inhaler   Other Relevant Orders   CBC with Differential/Platelet   Comprehensive metabolic panel with GFR     Nervous and Auditory   Impacted cerumen of right ear   Procedure: Ear irrigation performed by CMA, Amber using warm water. Cerumen not successfully cleared out, will refer to ENT for further evaluation. Patient tolerated the procedure well.       Relevant Orders   Ambulatory referral to ENT     Musculoskeletal and Integument   Greater trochanteric pain syndrome of left lower extremity   Follows with Sports Medicine. Receiving IM steroid  injections.       Osteoporosis   On fosfomax. Pt reports last Dexa scan was 2024. Records requested. Encouraged daily intake of calcium and vitamin D. Advised engaging in regular exercise as tolerated to support overall bone and CV health.       Relevant Orders   TSH   Vitamin D (25 hydroxy)     Other   Annual physical exam - Primary   Patient encouraged to maintain heart healthy diet, regular exercise, adequate sleep. Consider daily probiotics. Take medications as prescribed.    HCM Pap: No pap; TAH Mgm: Last 10/25, repeat 1 year Immunizations: Pt declines shingles, PNA, TDAP CRC screen: FH Colon Cancer, Pt reports last CRC screen 8 years ago, records requested       Relevant Orders   Hepatitis B surface antibody,quantitative   CBC with Differential/Platelet   Comprehensive metabolic panel with GFR   Lipid panel   TSH   Generalized anxiety disorder   Stable.       Relevant Orders   TSH   Iron deficiency anemia   Asymptomatic.  Update CBC, Iron panel.      Relevant Orders   Iron, TIBC and Ferritin Panel      I have discontinued Jhania Kristensen's linaclotide . I am also having her maintain her fluticasone, propranolol, OVER THE COUNTER MEDICATION, ibuprofen, multivitamin with minerals, Vitamin D-3, Probiotic Daily, ferrous sulfate, cyclobenzaprine , fexofenadine, ondansetron , phentermine, alendronate, montelukast , cromolyn , budesonide -formoterol , Airsupra , and albuterol.  No orders of the defined types were placed in this encounter.     [1]  Allergies Allergen Reactions   Amoxicillin    Statins

## 2024-06-08 NOTE — Assessment & Plan Note (Addendum)
 Patient encouraged to maintain heart healthy diet, regular exercise, adequate sleep. Consider daily probiotics. Take medications as prescribed.    HCM Pap: No pap; TAH Mgm: Last 10/25, repeat 1 year Immunizations: Pt declines shingles, PNA, TDAP CRC screen: FH Colon Cancer, Pt reports last CRC screen 8 years ago, records requested

## 2024-06-08 NOTE — Assessment & Plan Note (Signed)
 Asymptomatic.  Update CBC, Iron panel.

## 2024-06-08 NOTE — Addendum Note (Signed)
 Addended by: WHEELER HARLENE CROME on: 06/08/2024 09:46 AM   Modules accepted: Orders

## 2024-06-08 NOTE — Assessment & Plan Note (Signed)
 Follows with Sports Medicine. Receiving IM steroid injections.

## 2024-06-08 NOTE — Assessment & Plan Note (Addendum)
 Procedure: Ear irrigation performed by CMA, Amber using warm water. Cerumen not successfully cleared out, will refer to ENT for further evaluation. Patient tolerated the procedure well.

## 2024-06-08 NOTE — Assessment & Plan Note (Signed)
 On fosfomax. Pt reports last Dexa scan was 2024. Records requested. Encouraged daily intake of calcium and vitamin D. Advised engaging in regular exercise as tolerated to support overall bone and CV health.

## 2024-06-08 NOTE — Assessment & Plan Note (Signed)
 Well-controlled with budesonide  and ProAir. No recent exacerbations.

## 2024-06-08 NOTE — Assessment & Plan Note (Signed)
 Stable

## 2024-06-09 LAB — IRON,TIBC AND FERRITIN PANEL
%SAT: 57 % — ABNORMAL HIGH (ref 16–45)
Ferritin: 177 ng/mL (ref 16–232)
Iron: 168 ug/dL — ABNORMAL HIGH (ref 45–160)
TIBC: 293 ug/dL (ref 250–450)

## 2024-06-09 LAB — HEPATITIS B SURFACE ANTIBODY, QUANTITATIVE: Hep B S AB Quant (Post): <5 m[IU]/mL — ABNORMAL LOW (ref >=10)

## 2024-06-12 ENCOUNTER — Telehealth: Payer: Self-pay

## 2024-06-12 DIAGNOSIS — Z0110 Encounter for examination of ears and hearing without abnormal findings: Secondary | ICD-10-CM | POA: Diagnosis not present

## 2024-06-12 NOTE — Telephone Encounter (Unsigned)
 Copied from CRM #[id number]. Topic: General - Other >> Jun 12, 2024  3:34 PM Shelley Armstrong wrote: Reason for CRM: patient called stating the provider told her ear was blocked and she could not get the ear wax out so the patient went to ENT at [facility] today and they said her ears were clear and there was nothing to get out [phone]

## 2024-06-13 NOTE — Telephone Encounter (Unsigned)
 Copied from CRM #[id number]. Topic: Clinical - Medical Advice >> Jun 13, 2024 12:55 PM Ashley R wrote: Reason for CRM: Requesting callback [phone] - Crm resolved yesterday was asking for callback with an explanation for referral to ENT given nothing was found, no blockage.

## 2024-06-13 NOTE — Telephone Encounter (Signed)
 Called patient and advised her what the provider stated

## 2024-06-14 NOTE — Telephone Encounter (Unsigned)
 Copied from CRM #[id number]. Topic: Clinical - Medical Advice >> Jun 14, 2024  2:32 PM Emylou G wrote: Pls call patient.. she is still confused?  Needs clarity.. see msgs below.. she called back again - she prefers the doctor to call her

## 2024-06-19 ENCOUNTER — Ambulatory Visit: Admitting: Family Medicine

## 2024-07-10 ENCOUNTER — Ambulatory Visit: Admitting: Internal Medicine

## 2024-07-12 ENCOUNTER — Telehealth: Payer: Self-pay

## 2024-07-12 ENCOUNTER — Ambulatory Visit: Admitting: Student

## 2024-07-12 ENCOUNTER — Encounter: Payer: Self-pay | Admitting: Student

## 2024-07-12 VITALS — BP 134/88 | HR 98 | Temp 97.8°F | Resp 16 | Ht 65.5 in | Wt 158.0 lb

## 2024-07-12 DIAGNOSIS — Z0000 Encounter for general adult medical examination without abnormal findings: Secondary | ICD-10-CM

## 2024-07-12 DIAGNOSIS — R5383 Other fatigue: Secondary | ICD-10-CM | POA: Diagnosis not present

## 2024-07-12 DIAGNOSIS — E611 Iron deficiency: Secondary | ICD-10-CM

## 2024-07-12 DIAGNOSIS — R638 Other symptoms and signs concerning food and fluid intake: Secondary | ICD-10-CM | POA: Diagnosis not present

## 2024-07-12 DIAGNOSIS — M791 Myalgia, unspecified site: Secondary | ICD-10-CM | POA: Diagnosis not present

## 2024-07-12 MED ORDER — SEMAGLUTIDE 3 MG PO TABS: ORAL_TABLET | ORAL | 0 refills | Status: DC

## 2024-07-12 MED ORDER — IRON (FERROUS SULFATE) 325 (65 FE) MG PO TABS: 325.0000 mg | ORAL_TABLET | ORAL | 3 refills | Status: AC

## 2024-07-12 MED ORDER — SEMAGLUTIDE-WEIGHT MANAGEMENT 1.5 MG PO TABS: ORAL_TABLET | ORAL | 0 refills | Status: DC

## 2024-07-12 NOTE — Telephone Encounter (Signed)
 Copied from CRM [id number]. Topic: Clinical - Prescription Issue >> Jul 12, 2024  4:10 PM Thersia BROCKS wrote: Reason for CRM: Patient called in regarding semaglutide -weight management (WEGOVY ) 1.5 MG tablet , was told by pharmacy that they have not received it would like a callback

## 2024-07-12 NOTE — Telephone Encounter (Unsigned)
 Copied from CRM [id number]. Topic: Clinical - Prescription Issue >> Jul 12, 2024  4:23 PM Nessti S wrote: Reason for CRM: pt called back about wegovy  tablet. Relayed message from cma on mychart. Pt wants to know if the wegovy  shot is covered. She wants to know what should she do. She would like a call back concerning this issue.

## 2024-07-12 NOTE — Telephone Encounter (Signed)
 PA initiated via Covermymeds; KEY: [id number]   Message from [facility]: Drug is not covered by plan

## 2024-07-12 NOTE — Progress Notes (Addendum)
 "  Acute Office Visit  Subjective:     Patient ID: Shelley Armstrong, female    DOB: 1970/10/30, 54 y.o.   MRN: [id number]  Chief Complaint  Patient presents with   Weight Loss    Patient is here to discuss weight loss management ; As well as low iron  levels    HPI  History of Present Illness Shelley Armstrong is a 54 year old female who presents with fatigue and muscle aches.  She reports persistent fatigue and muscle soreness in her legs, arms, and back despite adequate sleep. She stopped over-the-counter iron  65 mg daily about one month ago after long-term use.She has Hx IDA. She previously lost significant weight on Wegovy  from 175 lbs to 120 lbs but has gained weight back, difficult keeping off weight. She is now considering weight management options and wants to maintain a healthy weight. She would like to start Wegovy .  Patient denies fever, chills, SOB, CP, palpitations, dyspnea, edema, HA, vision changes, N/V/D, abdominal pain, urinary symptoms, rash, weight changes, and recent illness or hospitalizations.   ROS  See HPI    Objective:    BP 134/88 (BP Location: Left Arm, Patient Position: Sitting, Cuff Size: Normal)   Pulse 98   Temp 97.8 F (36.6 C) (Oral)   Resp 16   Ht 5' 5.5 (1.664 m)   Wt 158 lb (71.7 kg)   SpO2 98%   BMI 25.89 kg/m    Physical Exam General: No acute distress. Awake and conversant.  Eyes: Normal conjunctiva, anicteric. Round symmetric pupils.  Neck: Neck is supple. No masses or thyromegaly.  Respiratory: CTAB. Respirations are non-labored. No wheezing.  Skin: Warm. No rashes or ulcers.  Psych: Alert and oriented. Cooperative CV: RRR. No murmur. No lower extremity edema.  MSK: Normal ambulation. No clubbing or cyanosis.     Results for orders placed or performed in visit on 07/12/24  B12 and Folate Panel  Result Value Ref Range   Vitamin B-12 325 211 - 911 pg/mL   Folate >23.4 >5.9 ng/mL  C-reactive protein  Result Value Ref Range   CRP <0.5  (L) 1.0 - 20.0 mg/dL  Antinuclear Antib (ANA)  Result Value Ref Range   Anti Nuclear Antibody (ANA) POSITIVE (A) NEGATIVE  Hepatitis C Antibody  Result Value Ref Range   Hepatitis C Ab NON-REACTIVE NON-REACTIVE  Anti-nuclear ab-titer (ANA titer)  Result Value Ref Range   ANA Titer 1 1:40 (H) titer   ANA Pattern 1 Nuclear, Homogeneous (A)         Assessment & Plan:   Problem List Items Addressed This Visit       Other   Increased BMI   Relevant Medications   semaglutide -weight management (WEGOVY ) 1.5 MG tablet   Other Relevant Orders   Amb Ref to Medical Weight Management   Iron  deficiency   Relevant Medications   Iron , Ferrous Sulfate , 325 (65 Fe) MG TABS   Myalgia - Primary   Relevant Orders   B12 and Folate Panel (Completed)   C-reactive protein (Completed)   Antinuclear Antib (ANA) (Completed)   Other fatigue   Relevant Medications   Iron , Ferrous Sulfate , 325 (65 Fe) MG TABS   Preventative health care   Relevant Orders   Hepatitis C Antibody (Completed)   IDA Resume iron  supplementation at 65 mg every other day (Monday, Wednesday, Friday). - Recheck iron  levels in March. - Checked B12 and folate levels.  Overweight and weight management Discussed oral semaglutide . Insurance coverage uncertain.  Referred to [facility]. - Start oral semaglutide  (Wegovy ) at 1.5 mg daily for 30 days, then increase to 3 mg daily for 30 days. - Referred to [facility]. - FU in 2 months  Fatigue and myalgia Persistent fatigue and myalgia.  -Check CRP, ANA, B 12 Vitamin D  - Start vitamin D  supplementation at 1000 IU daily.     Meds ordered this encounter  Medications   Iron , Ferrous Sulfate , 325 (65 Fe) MG TABS    Sig: Take 325 mg by mouth every other day.    Dispense:  30 tablet    Refill:  3    Supervising Provider:   DOMENICA BLACKBIRD A [[id number]]   DISCONTD: Semaglutide  3 MG TABS    Sig: Take 1 tablet (3 mg total) by  mouth daily for 30 days, THEN 2 tablets (6 mg total) daily.    Dispense:  90 tablet    Refill:  0    Supervising Provider:   DOMENICA BLACKBIRD A [[id number]]   DISCONTD: semaglutide -weight management (WEGOVY ) 1.5 MG tablet    Sig: Take 1 tablet (1.5 mg total) by mouth daily for 30 days, THEN 1 tablet (1.5 mg total) daily. Daily in AM on an empty stomach with 4 oz of water. Do not eat or drink for 30 minutes after dose.    Dispense:  60 tablet    Refill:  0    Supervising Provider:   DOMENICA BLACKBIRD A [[id number]]   semaglutide -weight management (WEGOVY ) 1.5 MG tablet    Sig: Take 1 tablet (1.5 mg total) by mouth daily for 30 days, THEN 2 tablets (3 mg total) daily. Daily in AM on an empty stomach with 4 oz of water. Do not eat or drink for 30 minutes after dose.    Dispense:  90 tablet    Refill:  0    Supervising Provider:   DOMENICA BLACKBIRD A [[id number]]    Return in about 2 months (around 09/09/2024).  Harlene LITTIE Jolly, NP   "

## 2024-07-13 ENCOUNTER — Telehealth: Payer: Self-pay | Admitting: Neurology

## 2024-07-13 LAB — C-REACTIVE PROTEIN: CRP: <0.5 mg/dL — ABNORMAL LOW (ref 1.0–20.0)

## 2024-07-13 LAB — B12 AND FOLATE PANEL
Folate: >23.4 ng/mL
Vitamin B-12: 325 pg/mL (ref 211–911)

## 2024-07-13 NOTE — Telephone Encounter (Signed)
 Mychart msg sent to Pt. Wt loss meds are a plan exclusion under her plan

## 2024-07-13 NOTE — Telephone Encounter (Signed)
 Looks like just seen yesterday, labs not back yet.    Copied from CRM [id number]. Topic: Clinical - Medical Advice >> Jul 13, 2024 10:31 AM Thersia BROCKS wrote: Reason for CRM: Patient is calling in stated she wants to know what she needs to do regarding her feeling fatigue and tired all of the time, if she needs a shot or something would like for nurse to give a call back

## 2024-07-15 ENCOUNTER — Ambulatory Visit: Payer: Self-pay | Admitting: Student

## 2024-07-15 DIAGNOSIS — R7689 Other specified abnormal immunological findings in serum: Secondary | ICD-10-CM

## 2024-07-15 DIAGNOSIS — R5383 Other fatigue: Secondary | ICD-10-CM | POA: Insufficient documentation

## 2024-07-15 DIAGNOSIS — R638 Other symptoms and signs concerning food and fluid intake: Secondary | ICD-10-CM | POA: Insufficient documentation

## 2024-07-15 DIAGNOSIS — M791 Myalgia, unspecified site: Secondary | ICD-10-CM

## 2024-07-15 DIAGNOSIS — E611 Iron deficiency: Secondary | ICD-10-CM | POA: Insufficient documentation

## 2024-07-15 DIAGNOSIS — R7989 Other specified abnormal findings of blood chemistry: Secondary | ICD-10-CM

## 2024-07-15 DIAGNOSIS — Z0000 Encounter for general adult medical examination without abnormal findings: Secondary | ICD-10-CM | POA: Insufficient documentation

## 2024-07-15 LAB — ANTI-NUCLEAR AB-TITER (ANA TITER): ANA Titer 1: 1:40 {titer} — ABNORMAL HIGH

## 2024-07-15 LAB — HEPATITIS C ANTIBODY: Hepatitis C Ab: NONREACTIVE

## 2024-07-15 LAB — ANA: Anti Nuclear Antibody (ANA): POSITIVE — AB

## 2024-07-15 MED ORDER — CYANOCOBALAMIN 500 MCG PO TABS: 500.0000 ug | ORAL_TABLET | Freq: Every day | ORAL | Status: AC

## 2024-07-17 ENCOUNTER — Encounter: Payer: Self-pay | Admitting: Student

## 2024-07-18 ENCOUNTER — Encounter: Payer: Self-pay | Admitting: Internal Medicine

## 2024-07-18 ENCOUNTER — Ambulatory Visit: Admitting: Internal Medicine

## 2024-07-18 VITALS — BP 120/70 | HR 100 | Temp 98.3°F | Resp 20 | Wt 159.0 lb

## 2024-07-18 DIAGNOSIS — J4530 Mild persistent asthma, uncomplicated: Secondary | ICD-10-CM

## 2024-07-18 DIAGNOSIS — H1045 Other chronic allergic conjunctivitis: Secondary | ICD-10-CM

## 2024-07-18 DIAGNOSIS — J301 Allergic rhinitis due to pollen: Secondary | ICD-10-CM | POA: Diagnosis not present

## 2024-07-18 MED ORDER — MONTELUKAST SODIUM 10 MG PO TABS: 10.0000 mg | ORAL_TABLET | Freq: Every day | ORAL | 1 refills | Status: AC

## 2024-07-18 MED ORDER — FLUTICASONE PROPIONATE 50 MCG/ACT NA SUSP: 1.0000 | Freq: Two times a day (BID) | NASAL | 5 refills | Status: DC

## 2024-07-18 MED ORDER — ALBUTEROL SULFATE HFA 108 (90 BASE) MCG/ACT IN AERS: 2.0000 | INHALATION_SPRAY | RESPIRATORY_TRACT | 5 refills | Status: AC | PRN

## 2024-07-18 MED ORDER — BUDESONIDE-FORMOTEROL FUMARATE 80-4.5 MCG/ACT IN AERO: 2.0000 | INHALATION_SPRAY | Freq: Two times a day (BID) | RESPIRATORY_TRACT | 1 refills | Status: AC

## 2024-07-18 NOTE — Patient Instructions (Addendum)
 Asthma, mild persistent  Breathing test: Looked great! - Controller Inhaler:  Continue  Symbicort  80mcg  2 puffs twice a day; This Should Be Used Everyday - Rinse mouth out after use - During respiratory illness or asthma flares: Increase Symbicort  4 puffs  and continue for 2 weeks or until symptoms resolve. - Rescue Inhaler: Albuterol  2 puffs. Use  every 4-6 hours as needed for chest tightness, wheezing, or coughing.  Can also use 15 minutes prior to exercise if you have symptoms with activity. - Asthma is not controlled if:  - Symptoms are occurring >2 times a week OR  - >2 times a month nighttime awakenings  - You are requiring systemic steroids (prednisone /steroid injections) more than once per year  - Your require hospitalization for your asthma.  - Please call the clinic to schedule a follow up if these symptoms arise   Allergic rhinitis with seasonal exacerbation - Allergy  test (04/04/24): positive to grass,  weed, tree - Continue avoidance measures  - Continue montelukast  10mg  nightly  - Continue flonase   1 spray per nostril twice daily. Aim upward and outward  - Continue cromolyn  eye drops: 1 drop per eye up to 4 times a day as needed  - Consider allergy  injections if interested  Follow up: 6 months   Thank you so much for letting me partake in your care today.  Don't hesitate to reach out if you have any additional concerns!  Hargis Springer, MD  [facility] [place], [place]

## 2024-07-18 NOTE — Progress Notes (Signed)
 "  FOLLOW UP Date of Service/Encounter:  07/18/24  Subjective:  Shelley Armstrong (DOB: [birth year]) is a 54 y.o. female who [facility] in re-evaluation of the following: asthma, rhinitis  History obtained from: chart review and patient.  For Review, LV was on 04/04/25  with Dr. Lorin seen for skin testing . See below for summary of history and diagnostics.   Therapeutic plans/changes recommended: added avoidance measures  ----------------------------------------------------- Pertinent History/Diagnostics:  Asthma: Asthma exacerbated by exercise, cold air, strong odors, and allergies - nrml  spirometry (03/28/25): ratio 77, 3.04L, 111% FEV1 (pre),  - Rx: symbicort  started (10/25) Allergic Rhinitis:  Seasonal allergies with sinus pressure, congestion, itchy eyes, and headaches. - SPT environmental panel (04/04/25): grass,  weed, tree - rx: montelukast , floanse, cromolyn , OTC antihistamine  --------------------------------------------------- Today presents for follow-up. Discussed the use of AI scribe software for clinical note transcription with the patient, who gave verbal consent to proceed.  History of Present Illness Shelley Armstrong is a 54 year old female with asthma who presents for follow-up of her respiratory symptoms.  Lower respiratory symptoms - Asthma managed with Symbicort  (budesonide /formoterol ) two puffs twice daily as maintenance inhaler - No use of rescue inhaler (Airsupra ) since starting Symbicort ; insurance did not cover Airsupra  but she still has her sample and has not needed it  - No coughing, wheezing, or shortness of breath requiring rescue inhaler - No need for oral steroids, antibiotics, or emergency room visits since starting Symbicort   Upper airway symptoms - Experienced allergy  symptoms two weeks ago including rhinorrhea, nasal congestion, and itchy, watery eyes - Symptoms managed with Allegra in the morning,  montelukast  at night, and occasional use of Sambucol and Sudafed - Regular use of nasal spray - Allegra D used only as needed for colds now  - rare use of eye drops       All medications reviewed by clinical staff and updated in chart. No new pertinent medical or surgical history except as noted in HPI.  ROS: All others negative except as noted per HPI.   Objective:  BP 120/70 (BP Location: Left Arm, Patient Position: Sitting, Cuff Size: Normal)   Pulse 100   Temp 98.3 F (36.8 C) (Oral)   Resp 20   Wt 159 lb (72.1 kg)   SpO2 100%   BMI 26.06 kg/m  Body mass index is 26.06 kg/m. Physical Exam: General Appearance:  Alert, cooperative, no distress, appears stated age  Head:  Normocephalic, without obvious abnormality, atraumatic  Eyes:  Conjunctiva clear, EOM's intact  Ears EACs normal bilaterally and normal TMs bilaterally  Nose: Nares normal, normal mucosa, no visible anterior polyps, and septum midline  Throat: Lips, tongue normal; teeth and gums normal, normal posterior oropharynx  Neck: Supple, symmetrical  Lungs:   clear to auscultation bilaterally, Respirations unlabored, no coughing  Heart:  regular rate and rhythm and no murmur, Appears well perfused  Extremities: No edema  Skin: Skin color, texture, turgor normal and no rashes or lesions on visualized portions of skin  Neurologic: No gross deficits   Labs:  Lab Orders  No laboratory test(s) ordered today    Spirometry:  Tracings reviewed. Her effort: Good reproducible efforts. FVC: 4.07L FEV1: 2.85L, 104% predicted FEV1/FVC ratio: 0% Interpretation: Spirometry consistent with normal pattern.  Please see scanned spirometry results for details.   Assessment/Plan   Patient Instructions  Asthma, mild persistent  Breathing test: Looked great! - Controller Inhaler:  Continue  Symbicort  80mcg  2 puffs twice a day; This Should Be Used Everyday - Rinse mouth out after use - During respiratory illness or  asthma flares: Increase Symbicort  4 puffs  and continue for 2 weeks or until symptoms resolve. - Rescue Inhaler: Albuterol  2 puffs. Use  every 4-6 hours as needed for chest tightness, wheezing, or coughing.  Can also use 15 minutes prior to exercise if you have symptoms with activity. - Asthma is not controlled if:  - Symptoms are occurring >2 times a week OR  - >2 times a month nighttime awakenings  - You are requiring systemic steroids (prednisone /steroid injections) more than once per year  - Your require hospitalization for your asthma.  - Please call the clinic to schedule a follow up if these symptoms arise   Allergic rhinitis with seasonal exacerbation - Allergy  test (04/04/24): positive to grass,  weed, tree - Continue avoidance measures  - Continue montelukast  10mg  nightly  - Continue flonase   1 spray per nostril twice daily. Aim upward and outward  - Continue cromolyn  eye drops: 1 drop per eye up to 4 times a day as needed  - Consider allergy  injections if interested  Follow up: 6 months   Thank you so much for letting me partake in your care today.  Don't hesitate to reach out if you have any additional concerns!  Hargis Springer, MD  [facility] [place], [place]   Other: reviewed spirometry technique and reviewed inhaler technique  Thank you so much for letting me partake in your care today.  Don't hesitate to reach out if you have any additional concerns!  Hargis Springer, MD  [facility] [place], [place]        "

## 2024-07-19 ENCOUNTER — Ambulatory Visit: Admitting: Student

## 2024-07-19 ENCOUNTER — Encounter: Payer: Self-pay | Admitting: Student

## 2024-07-19 ENCOUNTER — Other Ambulatory Visit: Payer: Self-pay | Admitting: *Deleted

## 2024-07-19 VITALS — BP 117/70 | HR 96 | Temp 98.0°F | Resp 15 | Ht 65.5 in | Wt 159.6 lb

## 2024-07-19 DIAGNOSIS — E663 Overweight: Secondary | ICD-10-CM

## 2024-07-19 DIAGNOSIS — F411 Generalized anxiety disorder: Secondary | ICD-10-CM | POA: Diagnosis not present

## 2024-07-19 DIAGNOSIS — M791 Myalgia, unspecified site: Secondary | ICD-10-CM

## 2024-07-19 DIAGNOSIS — Z6826 Body mass index (BMI) 26.0-26.9, adult: Secondary | ICD-10-CM | POA: Diagnosis not present

## 2024-07-19 DIAGNOSIS — R7689 Other specified abnormal immunological findings in serum: Secondary | ICD-10-CM

## 2024-07-19 DIAGNOSIS — M810 Age-related osteoporosis without current pathological fracture: Secondary | ICD-10-CM

## 2024-07-19 DIAGNOSIS — Z7689 Persons encountering health services in other specified circumstances: Secondary | ICD-10-CM

## 2024-07-19 DIAGNOSIS — J45909 Unspecified asthma, uncomplicated: Secondary | ICD-10-CM | POA: Diagnosis not present

## 2024-07-19 LAB — CK: Total CK: 101 U/L (ref 17–177)

## 2024-07-19 LAB — SEDIMENTATION RATE: Sed Rate: 5 mm/h (ref 0–30)

## 2024-07-19 MED ORDER — ALENDRONATE SODIUM 70 MG PO TABS: 70.0000 mg | ORAL_TABLET | ORAL | 1 refills | Status: AC

## 2024-07-19 MED ORDER — FLUTICASONE PROPIONATE 50 MCG/ACT NA SUSP: 1.0000 | Freq: Two times a day (BID) | NASAL | 1 refills | Status: AC

## 2024-07-19 MED ORDER — PROPRANOLOL HCL 10 MG PO TABS: 10.0000 mg | ORAL_TABLET | Freq: Two times a day (BID) | ORAL | 1 refills | Status: AC | PRN

## 2024-07-19 NOTE — Telephone Encounter (Signed)
 Received fax request from [facility] for a 90 day supply of Flonase . This has been sent.

## 2024-07-19 NOTE — Assessment & Plan Note (Signed)
 On fosfomax. Rx refilled Pt reports last Dexa scan was 2024. Records requested.  Encouraged daily intake of calcium and vitamin D . Advised engaging in regular exercise as tolerated to support overall bone and CV health

## 2024-07-19 NOTE — Assessment & Plan Note (Signed)
 Her BMI today is 46. Encouraged ongoing weight-loss efforts, as even modest reductions can significantly improve overall health. Encouraged DASH or MIND diet, decrease po intake and increase exercise as tolerated. Needs 7-8 hours of sleep nightly. Avoid trans fats, eat small, frequent meals every 4-5 hours with lean proteins, complex carbs and healthy fats. Minimize simple carbs, high fat foods and processed foods

## 2024-07-19 NOTE — Assessment & Plan Note (Addendum)
 Stable. Previously scheduled BID daily, will change this to BID as needed. Advise tapering down propanolol to 10 mg daily for 5-7 days, then taking as needed up to twice daily.

## 2024-07-19 NOTE — Progress Notes (Addendum)
 "  Acute Office Visit  Subjective:     Patient ID: Shelley Armstrong, female    DOB: 02/25/1971, 54 y.o.   MRN: [id number]  Chief Complaint  Patient presents with   Medication Management    Patient is here to discuss Wegovy  and other weight loss medications.    HPI  History of Present Illness Shelley Armstrong is a 54 year old female who presents for follow up.  She is interested in Wegovy  for weight management. Her BMI is 26. Prior authorization was denied.  She has osteoporosis in her back confirmed by bone density scan last year. She takes a weekly osteoporosis medication started last year, has a three-month supply remaining, and needs a refill as she is no longer seeing the prior prescriber.  She has chornic muscle and joint pain and has a rheumatology appointment planned in April.  PMHX- IDA, Osteoporosis, Asthma  Osteoporosis- On Fosamax  70 mg daily  Asthma- Symbicort , albuterol  prn  GAD- Propanolol 10 mg bid prn  Compliant with medications  Patient denies fever, chills, SOB, CP, palpitations, dyspnea, edema, HA, vision changes, N/V/D, abdominal pain, urinary symptoms, rash, weight changes, and recent illness or hospitalizations.   ROS  See HPI    Objective:    BP 117/70 (BP Location: Right Arm, Patient Position: Sitting, Cuff Size: Normal)   Pulse 96   Temp 98 F (36.7 C) (Oral)   Resp 15   Ht 5' 5.5 (1.664 m)   Wt 159 lb 9.6 oz (72.4 kg)   SpO2 100%   BMI 26.15 kg/m    Physical Exam General: No acute distress. Awake and conversant.  Eyes: Normal conjunctiva, anicteric. Round symmetric pupils.  Neck: Neck is supple. No masses or thyromegaly.  Respiratory: CTAB. Respirations are non-labored. No wheezing.  Skin: Warm. No rashes or ulcers.  Psych: Alert and oriented. Cooperative  CV: RRR. No murmur. No lower extremity edema.  MSK: Normal ambulation. No clubbing or cyanosis.     Results for orders placed or performed in visit on 07/19/24  Rheumatoid Factor   Result Value Ref Range   Rheumatoid fact SerPl-aCnc <10 <14 IU/mL  Sedimentation rate  Result Value Ref Range   Sed Rate 5 0 - 30 mm/hr  CK (Creatine Kinase)  Result Value Ref Range   Total CK 101 17 - 177 U/L         Assessment & Plan:   Problem List Items Addressed This Visit       Respiratory   Asthma   Well-controlled with budesonide  and ProAir . No recent exacerbations.         Musculoskeletal and Integument   Osteoporosis   On fosfomax. Rx refilled Pt reports last Dexa scan was 2024. Records requested.  Encouraged daily intake of calcium and vitamin D . Advised engaging in regular exercise as tolerated to support overall bone and CV health       Relevant Medications   alendronate  (FOSAMAX ) 70 MG tablet     Other   Encounter for weight management   Patient requested initiation of GLP-1 therapy. Discussed that her insurance is unlikely to cover GLP-1 medication as she does not meet current coverage criteria. Patient expressed confusion and believed the provider would need to contact the insurance company; clarified that prior authorization would likely not be approved given lack of eligibility. Due to concerns regarding appropriateness, I am not comfortable prescribing GLP-1 therapy at this time. Pt voiced understanding of this. Referral has been placed to [facility] for further evaluation and management. Encouraged patient to follow up with this referral.      Generalized anxiety disorder   Stable. Previously scheduled BID daily, will change this to BID as needed. Advise tapering down propanolol to 10 mg daily for 5-7 days, then taking as needed up to twice daily.       Relevant Medications   propranolol  (INDERAL ) 10 MG tablet   Myalgia   Update RF,ESR, CK Upcoming appointment in April with Rhuematology      Overweight (BMI 25.0-29.9) - Primary   Encouraged DASH or MIND diet, decrease po intake and increase exercise as tolerated. Needs 7-8  hours of sleep nightly. Avoid trans fats, eat small, frequent meals every 4-5 hours with lean proteins, complex carbs and healthy fats. Minimize simple carbs, high fat foods and processed foods        Other Visit Diagnoses       Positive ANA (antinuclear antibody)          FU 2 months   Meds ordered this encounter  Medications   propranolol  (INDERAL ) 10 MG tablet    Sig: Take 1 tablet (10 mg total) by mouth 2 (two) times daily as needed.    Dispense:  90 tablet    Refill:  1   alendronate  (FOSAMAX ) 70 MG tablet    Sig: Take 1 tablet (70 mg total) by mouth once a week.    Dispense:  12 tablet    Refill:  1    Supervising Provider:   DOMENICA BLACKBIRD A [[id number]]    No follow-ups on file.  Shelley LITTIE Jolly, NP   "

## 2024-07-19 NOTE — Addendum Note (Signed)
 Addended by: ONEITA CHRISTIANS D on: 07/19/2024 02:25 PM   Modules accepted: Orders

## 2024-07-19 NOTE — Assessment & Plan Note (Signed)
 Well-controlled with budesonide  and ProAir. No recent exacerbations.

## 2024-07-19 NOTE — Assessment & Plan Note (Signed)
 Update RF,ESR, CK Upcoming appointment in April with Rhuematology

## 2024-07-20 ENCOUNTER — Ambulatory Visit: Payer: Self-pay | Admitting: Student

## 2024-07-20 DIAGNOSIS — Z7689 Persons encountering health services in other specified circumstances: Secondary | ICD-10-CM | POA: Insufficient documentation

## 2024-07-20 LAB — RHEUMATOID FACTOR: Rheumatoid fact SerPl-aCnc: <10 [IU]/mL

## 2024-07-20 NOTE — Assessment & Plan Note (Addendum)
 Patient requested initiation of GLP-1 therapy. Discussed that her insurance is unlikely to cover GLP-1 medication as she does not meet current coverage criteria. Patient expressed confusion and believed the provider would need to contact the insurance company; clarified that prior authorization would likely not be approved given lack of eligibility. Due to concerns regarding appropriateness, I am not comfortable prescribing GLP-1 therapy at this time. Pt voiced understanding of this. Referral has been placed to [facility] for further evaluation and management. Encouraged patient to follow up with this referral.

## 2024-07-20 NOTE — Telephone Encounter (Signed)
 Copied from CRM [id number]. Topic: Clinical - Medication Prior Auth >> Jul 20, 2024  9:58 AM Deleta RAMAN wrote: Reason for CRM: [facility] is calling due to prior authorization need for patient wegoovy. Please contact the patient for concerns or [facility] at [phone]. Fax [phone]

## 2024-07-25 ENCOUNTER — Encounter: Payer: Self-pay | Admitting: Student

## 2024-07-25 ENCOUNTER — Other Ambulatory Visit: Payer: Self-pay

## 2024-07-25 DIAGNOSIS — E663 Overweight: Secondary | ICD-10-CM

## 2024-07-27 ENCOUNTER — Other Ambulatory Visit (HOSPITAL_BASED_OUTPATIENT_CLINIC_OR_DEPARTMENT_OTHER): Payer: Self-pay

## 2024-07-27 ENCOUNTER — Ambulatory Visit

## 2024-07-27 ENCOUNTER — Other Ambulatory Visit: Payer: Self-pay

## 2024-07-27 VITALS — Ht 65.5 in | Wt 159.0 lb

## 2024-07-27 DIAGNOSIS — S7012XA Contusion of left thigh, initial encounter: Secondary | ICD-10-CM

## 2024-07-27 DIAGNOSIS — M25561 Pain in right knee: Secondary | ICD-10-CM | POA: Diagnosis not present

## 2024-07-27 DIAGNOSIS — M1612 Unilateral primary osteoarthritis, left hip: Secondary | ICD-10-CM | POA: Diagnosis not present

## 2024-07-27 DIAGNOSIS — M79652 Pain in left thigh: Secondary | ICD-10-CM

## 2024-07-27 DIAGNOSIS — M25552 Pain in left hip: Secondary | ICD-10-CM | POA: Diagnosis not present

## 2024-07-27 MED ORDER — NAPROXEN 500 MG PO TABS
500.0000 mg | ORAL_TABLET | Freq: Two times a day (BID) | ORAL | 0 refills | Status: AC
  Filled 2024-07-27: qty 28, 14d supply, fill #0

## 2024-07-27 MED ORDER — METHYLPREDNISOLONE ACETATE 40 MG/ML IJ SUSP
40.0000 mg | Freq: Once | INTRAMUSCULAR | Status: AC
  Administered 2024-07-27: 40 mg via INTRA_ARTICULAR

## 2024-07-27 NOTE — Progress Notes (Signed)
 "  Subjective:    Patient ID: Shelley Armstrong, female    DOB: 54 y.o., 1971/04/04   MRN: [id number]  Chief Complaint: Left hip pain, left thigh pain, right knee pain (s/p fall during trail run)  History of Present Illness Shelley Armstrong is a 54 year old female with left hip osteoarthritis who presents with persistent left hip and right knee pain following a fall.  Left Hip Pain and Osteoarthritis: - Chronic left hip pain due to osteoarthritis - Previously managed with corticosteroid injections; last injection three months ago provided near-complete relief - Current pain localizes to the hip with radiation to the groin and lateral thigh - Increased pain and tightness with side-to-side rolling and twisting of the hip - Worst at night and in the morning - No mechanical knee symptoms such as clicking, locking, or catching  Right Thigh and Knee Pain: - Persistent pain in the right thigh and knee since a fall two weeks ago - Associated with contusions and ecchymosis over the right thigh - No mechanical symptoms in the knee - Worst at night and in the morning  Nocturnal Foot Discomfort: - Nocturnal discomfort in the feet  Prior Injuries and Symptom Resolution: - Developed contusions and ecchymosis over the left hip and ankle after the fall - Ankle bruising and pain have resolved - Prior right 4th toe pain improved with metatarsal pad and is not currently bothersome  Functional Impact: - Unable to run for four weeks due to pain and weather - Did not seek acute care after the fall as symptoms improved enough to walk without difficulty  Analgesic Use: - Uses over-the-counter acetaminophen and naproxen , mainly at night - No recent use of prescription anti-inflammatories     Objective:   Right knee: Small patch of bruising present over the medial aspect.  No palpable effusion.  Nontender over the medial and lateral joint lines.  Left knee/thigh: No palpable effusion.  Prominent patch of  bruising present over the superolateral aspect of the knee near the belly of the vastus lateralis musculature.  Tenderness to palpation over this area.  Nontender over the medial lateral joint lines of the left knee.  Left hip: Pain radiating toward her groin and anterior thigh with FADIR, FABER. Mild tenderness palpation present over the greater trochanter. Equivocal scour, axial load. Negative logroll.  Limited ultrasound evaluation of the bilateral lower extremities: The suprapatellar pouch of the knee is well-visualized and appears to carry a small amount of hypoechogenic fluid signal. The medial meniscus of the right knee is well-visualized and appears normal. The suprapatellar pouch of the left knee is well-visualized and appears normal. The medial meniscus of the left knee is well-visualized and appears normal The lateral meniscus of the left knee is well-visualized and appears normal. The area of prominent bruising over the vastus lateralis musculature when scanned reveals normal-appearing muscle architecture without evidence of intramuscular contusion.  There is a hyperechogenic character to the subcutaneous fat tissue, however. The left hip joint is well-visualized and appears to have some degenerative changes present around the head of the humerus.  Prominent hypoechogenic fluid present within the femoroacetabular joint Impression: --Small effusion in right knee --Moderate effusion of left hip --Uncomplicated bruising of the subcutaneous tissue of the lateral thigh.  No evidence of myositis ossificans. --No evidence of meniscus tearing of either knee  Left Intra-articular Hip Injection with Ultrasound Guidance Procedure Note Zaley Talley [birth year] Indications: Pain Procedure Details Following the description of risks including infection, bleeding, damage to surrounding  structures, patient provided written consent for Left  hip joint injection procedure. US  was used to identify the  femoral head-neck junction and doppler ultrasound was used to identify the nearby vasculature. Patient was sterilely prepped in the usual fashion with alcohol .  Following topical anesthetization with ethyl chloride they were injected with a solution of 40mg  Depo-medrol  and 3cc Mepivacaine 2% via the anterior approach into the femoroacetabular joint capsule. This was well visualized under ultrasound, please see associated photographic documentation. Patient tolerated well without complication.  Precautions provided. Cleaned and dressing applied.    Assessment & Plan:   Assessment & Plan Left hip osteoarthritis with acute exacerbation Chronic left hip osteoarthritis has acutely exacerbated following recent trauma. Symptoms align with previous flares, which responded well to corticosteroid injections. Ultrasound shows mild intra-articular fluid and trauma evidence without significant acute pathology. The exacerbation is due to a recent fall. An injection was performed to provide symptomatic relief. Prescription-strength anti-inflammatory medication is prescribed for 1-2 weeks to reduce inflammation and prevent myositis ossificans. The injection will not address superficial ecchymosis. She received education on anti-inflammatory use and its effects.  Acute right knee pain She experiences acute right knee pain post-trauma, with ecchymosis and nocturnal discomfort. Ultrasound shows no effusion, meniscal injury, or significant intra-articular pathology, and there are no mechanical symptoms. The injury is mild and should improve with conservative management. An ultrasound evaluation was performed to rule out acute pathology. Continued anti-inflammatory therapy is recommended for analgesia and myositis ossificans prevention. She was reassured about the mild nature of the injury and its expected improvement with conservative management.  Left thigh contusion A right thigh contusion from a recent fall presents with  resolving ecchymosis and localized pain. Ultrasound reveals patchy areas of resolving hematoma without myositis ossificans or significant muscle injury. The condition is expected to resolve with time and continued anti-inflammatory therapy. An ultrasound evaluation was conducted to assess for complications. Continued anti-inflammatory therapy is advised for analgesia and myositis ossificans prevention. She was educated on the natural healing course of contusions and the anticipated resolution over time.   "

## 2024-08-03 ENCOUNTER — Other Ambulatory Visit: Payer: Self-pay

## 2024-08-03 ENCOUNTER — Telehealth: Payer: Self-pay

## 2024-08-03 DIAGNOSIS — E663 Overweight: Secondary | ICD-10-CM

## 2024-08-03 DIAGNOSIS — Z7689 Persons encountering health services in other specified circumstances: Secondary | ICD-10-CM

## 2024-08-03 NOTE — Telephone Encounter (Unsigned)
 Copied from CRM #[id number]. Topic: General - Other >> Aug 03, 2024 10:47 AM Burnard DEL wrote: Reason for CRM: Patient called in stating that she needs the [facility] code for vitamin D  lab  that was done on December 12th to be resubmitted to her insurance. Her insurance isnt paying for it,they stated that they only pay once every two years,and that provider would have to put in a different diagnostic code for insurance to pay for it.  Ref#:[id number] [employer] nsurance

## 2024-08-14 ENCOUNTER — Ambulatory Visit: Admitting: Student

## 2024-09-11 ENCOUNTER — Ambulatory Visit: Admitting: Student

## 2024-10-10 ENCOUNTER — Ambulatory Visit: Admitting: Internal Medicine

## 2025-06-14 ENCOUNTER — Encounter: Admitting: Student
